# Patient Record
Sex: Female | Born: 1960 | Race: Black or African American | Hispanic: No | Marital: Married | Smoking: Former smoker
Health system: Southern US, Community
[De-identification: ages and names within clinical notes are randomized; demographics above are authoritative.]

## PROBLEM LIST (undated history)

## (undated) DIAGNOSIS — I82409 Acute embolism and thrombosis of unspecified deep veins of unspecified lower extremity: Secondary | ICD-10-CM

## (undated) DIAGNOSIS — T7840XA Allergy, unspecified, initial encounter: Secondary | ICD-10-CM

## (undated) DIAGNOSIS — E7800 Pure hypercholesterolemia, unspecified: Secondary | ICD-10-CM

## (undated) DIAGNOSIS — G459 Transient cerebral ischemic attack, unspecified: Secondary | ICD-10-CM

## (undated) DIAGNOSIS — I10 Essential (primary) hypertension: Secondary | ICD-10-CM

## (undated) DIAGNOSIS — E119 Type 2 diabetes mellitus without complications: Secondary | ICD-10-CM

## (undated) HISTORY — DX: Allergy, unspecified, initial encounter: T78.40XA

## (undated) HISTORY — PX: SHOULDER SURGERY: SHX246

## (undated) HISTORY — DX: Transient cerebral ischemic attack, unspecified: G45.9

---

## 2003-07-23 HISTORY — PX: OTHER SURGICAL HISTORY: SHX169

## 2007-07-23 HISTORY — PX: OTHER SURGICAL HISTORY: SHX169

## 2009-12-14 ENCOUNTER — Emergency Department: Payer: Self-pay | Admitting: Emergency Medicine

## 2010-05-25 ENCOUNTER — Observation Stay: Payer: Self-pay | Admitting: *Deleted

## 2010-08-13 ENCOUNTER — Ambulatory Visit: Payer: Self-pay | Admitting: Family Medicine

## 2010-08-31 ENCOUNTER — Ambulatory Visit: Payer: Self-pay | Admitting: Otolaryngology

## 2010-09-03 ENCOUNTER — Ambulatory Visit: Payer: Self-pay | Admitting: Otolaryngology

## 2010-09-04 LAB — PATHOLOGY REPORT

## 2010-10-08 ENCOUNTER — Ambulatory Visit: Payer: Self-pay | Admitting: Otolaryngology

## 2010-10-09 LAB — PATHOLOGY REPORT

## 2011-07-23 DIAGNOSIS — I639 Cerebral infarction, unspecified: Secondary | ICD-10-CM | POA: Insufficient documentation

## 2011-07-23 DIAGNOSIS — G459 Transient cerebral ischemic attack, unspecified: Secondary | ICD-10-CM

## 2011-07-23 HISTORY — DX: Transient cerebral ischemic attack, unspecified: G45.9

## 2011-07-23 HISTORY — PX: OTHER SURGICAL HISTORY: SHX169

## 2011-09-09 ENCOUNTER — Ambulatory Visit: Payer: Self-pay | Admitting: Family Medicine

## 2012-06-09 ENCOUNTER — Emergency Department: Payer: Self-pay | Admitting: Emergency Medicine

## 2012-06-09 LAB — CBC
HCT: 43 % (ref 35.0–47.0)
HGB: 14.5 g/dL (ref 12.0–16.0)
MCH: 30.5 pg (ref 26.0–34.0)
MCHC: 33.7 g/dL (ref 32.0–36.0)
MCV: 91 fL (ref 80–100)
RDW: 14.9 % — ABNORMAL HIGH (ref 11.5–14.5)
WBC: 4.3 10*3/uL (ref 3.6–11.0)

## 2012-11-04 ENCOUNTER — Emergency Department: Payer: Self-pay | Admitting: Urology

## 2013-03-24 ENCOUNTER — Emergency Department: Payer: Self-pay | Admitting: Emergency Medicine

## 2013-03-24 DIAGNOSIS — I82412 Acute embolism and thrombosis of left femoral vein: Secondary | ICD-10-CM | POA: Insufficient documentation

## 2013-03-24 LAB — COMPREHENSIVE METABOLIC PANEL
Albumin: 3 g/dL — ABNORMAL LOW (ref 3.4–5.0)
Anion Gap: 3 — ABNORMAL LOW (ref 7–16)
BUN: 6 mg/dL — ABNORMAL LOW (ref 7–18)
Bilirubin,Total: 0.3 mg/dL (ref 0.2–1.0)
Calcium, Total: 9.2 mg/dL (ref 8.5–10.1)
Chloride: 107 mmol/L (ref 98–107)
Co2: 27 mmol/L (ref 21–32)
Creatinine: 0.72 mg/dL (ref 0.60–1.30)
EGFR (African American): >60
EGFR (Non-African Amer.): >60
Osmolality: 271 (ref 275–301)
Potassium: 3.8 mmol/L (ref 3.5–5.1)
SGOT(AST): 24 U/L (ref 15–37)
SGPT (ALT): 25 U/L (ref 12–78)

## 2013-03-24 LAB — CBC
HCT: 38.6 % (ref 35.0–47.0)
HGB: 13.2 g/dL (ref 12.0–16.0)
RBC: 4.28 10*6/uL (ref 3.80–5.20)
RDW: 15.5 % — ABNORMAL HIGH (ref 11.5–14.5)
WBC: 5.9 10*3/uL (ref 3.6–11.0)

## 2013-04-14 DIAGNOSIS — I82402 Acute embolism and thrombosis of unspecified deep veins of left lower extremity: Secondary | ICD-10-CM | POA: Insufficient documentation

## 2013-04-16 ENCOUNTER — Ambulatory Visit: Payer: Self-pay | Admitting: Internal Medicine

## 2013-04-21 ENCOUNTER — Ambulatory Visit: Payer: Self-pay | Admitting: Internal Medicine

## 2013-05-03 ENCOUNTER — Ambulatory Visit: Payer: Self-pay | Admitting: Internal Medicine

## 2013-05-04 LAB — PROT IMMUNOELECTROPHORES(ARMC)

## 2013-05-04 LAB — CANCER ANTIGEN 19-9: CA 19-9: <1 U/mL (ref 0–35)

## 2013-05-04 LAB — CEA: CEA: 3.4 ng/mL (ref 0.0–4.7)

## 2013-05-17 ENCOUNTER — Ambulatory Visit: Payer: Self-pay

## 2013-05-17 DIAGNOSIS — R760 Raised antibody titer: Secondary | ICD-10-CM | POA: Insufficient documentation

## 2013-05-22 ENCOUNTER — Ambulatory Visit: Payer: Self-pay | Admitting: Internal Medicine

## 2013-05-25 ENCOUNTER — Ambulatory Visit: Payer: Self-pay

## 2013-06-14 LAB — TSH: Thyroid Stimulating Horm: 2.11 u[IU]/mL

## 2013-06-21 ENCOUNTER — Ambulatory Visit: Payer: Self-pay | Admitting: Internal Medicine

## 2013-07-22 ENCOUNTER — Ambulatory Visit: Payer: Self-pay | Admitting: Internal Medicine

## 2013-08-22 ENCOUNTER — Ambulatory Visit: Payer: Self-pay | Admitting: Internal Medicine

## 2013-09-19 ENCOUNTER — Ambulatory Visit: Payer: Self-pay | Admitting: Internal Medicine

## 2013-10-19 ENCOUNTER — Emergency Department: Payer: Self-pay | Admitting: Emergency Medicine

## 2013-10-19 LAB — URINALYSIS, COMPLETE
BILIRUBIN, UR: NEGATIVE
Bacteria: NONE SEEN
Blood: NEGATIVE
GLUCOSE, UR: NEGATIVE mg/dL (ref 0–75)
KETONE: NEGATIVE
Leukocyte Esterase: NEGATIVE
Nitrite: NEGATIVE
PH: 7 (ref 4.5–8.0)
PROTEIN: NEGATIVE
SPECIFIC GRAVITY: 1.016 (ref 1.003–1.030)
WBC UR: 1 /HPF (ref 0–5)

## 2013-10-20 ENCOUNTER — Ambulatory Visit: Payer: Self-pay | Admitting: Internal Medicine

## 2013-10-22 LAB — CREATININE, SERUM: Creatinine: 0.93 mg/dL (ref 0.60–1.30)

## 2013-11-07 ENCOUNTER — Emergency Department: Payer: Self-pay | Admitting: Emergency Medicine

## 2013-11-07 LAB — COMPREHENSIVE METABOLIC PANEL
ALBUMIN: 3.6 g/dL (ref 3.4–5.0)
ANION GAP: 2 — AB (ref 7–16)
Alkaline Phosphatase: 61 U/L
BUN: 12 mg/dL (ref 7–18)
Bilirubin,Total: 0.2 mg/dL (ref 0.2–1.0)
CALCIUM: 8.8 mg/dL (ref 8.5–10.1)
CO2: 26 mmol/L (ref 21–32)
CREATININE: 0.87 mg/dL (ref 0.60–1.30)
Chloride: 111 mmol/L — ABNORMAL HIGH (ref 98–107)
EGFR (African American): >60
Glucose: 85 mg/dL (ref 65–99)
OSMOLALITY: 277 (ref 275–301)
POTASSIUM: 3.8 mmol/L (ref 3.5–5.1)
SGOT(AST): 28 U/L (ref 15–37)
SGPT (ALT): 29 U/L (ref 12–78)
SODIUM: 139 mmol/L (ref 136–145)
Total Protein: 7.6 g/dL (ref 6.4–8.2)

## 2013-11-07 LAB — CBC
HCT: 38.7 % (ref 35.0–47.0)
HGB: 12.4 g/dL (ref 12.0–16.0)
MCH: 28.3 pg (ref 26.0–34.0)
MCHC: 32.1 g/dL (ref 32.0–36.0)
MCV: 88 fL (ref 80–100)
PLATELETS: 273 10*3/uL (ref 150–440)
RBC: 4.41 10*6/uL (ref 3.80–5.20)
RDW: 16.8 % — ABNORMAL HIGH (ref 11.5–14.5)
WBC: 4.9 10*3/uL (ref 3.6–11.0)

## 2013-11-07 LAB — PROTIME-INR
INR: 1
Prothrombin Time: 13.1 s

## 2013-11-07 LAB — TROPONIN I: Troponin-I: <0.02 ng/mL

## 2013-11-19 ENCOUNTER — Ambulatory Visit: Payer: Self-pay | Admitting: Internal Medicine

## 2013-11-23 ENCOUNTER — Ambulatory Visit: Payer: Self-pay

## 2013-12-20 ENCOUNTER — Ambulatory Visit: Payer: Self-pay | Admitting: Internal Medicine

## 2014-01-19 ENCOUNTER — Ambulatory Visit: Payer: Self-pay | Admitting: Internal Medicine

## 2014-02-19 ENCOUNTER — Ambulatory Visit: Payer: Self-pay | Admitting: Internal Medicine

## 2014-02-23 ENCOUNTER — Ambulatory Visit: Payer: Self-pay | Admitting: Gastroenterology

## 2014-02-24 LAB — PATHOLOGY REPORT

## 2014-03-22 ENCOUNTER — Ambulatory Visit: Payer: Self-pay | Admitting: Internal Medicine

## 2014-04-11 LAB — CREATININE, SERUM
Creatinine: 0.94 mg/dL (ref 0.60–1.30)
EGFR (African American): >60
EGFR (Non-African Amer.): >60

## 2014-04-11 LAB — TSH: THYROID STIMULATING HORM: 1.36 u[IU]/mL

## 2014-04-11 LAB — T4, FREE: Free Thyroxine: 0.9 ng/dL (ref 0.76–1.46)

## 2014-04-11 LAB — CANCER CENTER HEMOGLOBIN: HGB: 13.4 g/dL (ref 12.0–16.0)

## 2014-04-21 ENCOUNTER — Ambulatory Visit: Payer: Self-pay | Admitting: Internal Medicine

## 2014-05-09 ENCOUNTER — Emergency Department: Payer: Self-pay | Admitting: Emergency Medicine

## 2014-05-20 ENCOUNTER — Ambulatory Visit: Payer: Self-pay | Admitting: Specialist

## 2014-05-22 ENCOUNTER — Ambulatory Visit: Payer: Self-pay | Admitting: Internal Medicine

## 2014-06-21 ENCOUNTER — Ambulatory Visit: Payer: Self-pay | Admitting: Internal Medicine

## 2014-07-06 ENCOUNTER — Ambulatory Visit: Payer: Self-pay

## 2014-07-26 LAB — HEPATIC FUNCTION PANEL: BILIRUBIN, TOTAL: 0.2 mg/dL

## 2014-07-26 LAB — BASIC METABOLIC PANEL
BUN: 17 mg/dL (ref 4–21)
CREATININE: 0.8 mg/dL (ref 0.5–1.1)
GLUCOSE: 109 mg/dL
Sodium: 139 mmol/L (ref 137–147)

## 2014-07-26 LAB — HEMOGLOBIN A1C: HEMOGLOBIN A1C: 6.7

## 2014-07-26 LAB — CBC AND DIFFERENTIAL
NEUTROS ABS: 3 /uL
WBC: 6 10^3/mL

## 2014-07-26 LAB — POCT ERYTHROCYTE SEDIMENTATION RATE, NON-AUTOMATED: Sed Rate: 55 mm

## 2014-07-28 ENCOUNTER — Ambulatory Visit: Payer: Self-pay | Admitting: Internal Medicine

## 2014-07-29 LAB — D-DIMER(ARMC): D-DIMER: 471 ng/mL

## 2014-08-22 ENCOUNTER — Ambulatory Visit: Payer: Self-pay | Admitting: Internal Medicine

## 2014-09-30 ENCOUNTER — Ambulatory Visit
Admit: 2014-09-30 | Disposition: A | Discharge status: Home / Self Care | Payer: Self-pay | Attending: Internal Medicine | Admitting: Internal Medicine

## 2014-12-07 ENCOUNTER — Ambulatory Visit: Payer: Self-pay | Admitting: Internal Medicine

## 2015-01-02 ENCOUNTER — Inpatient Hospital Stay: Payer: Self-pay | Attending: Hematology and Oncology | Admitting: Hematology and Oncology

## 2015-01-02 ENCOUNTER — Encounter: Payer: Self-pay | Admitting: Hematology and Oncology

## 2015-01-02 VITALS — BP 146/88 | HR 59 | Temp 96.8°F | Ht 64.0 in | Wt 212.7 lb

## 2015-01-02 DIAGNOSIS — I82402 Acute embolism and thrombosis of unspecified deep veins of left lower extremity: Secondary | ICD-10-CM | POA: Insufficient documentation

## 2015-01-02 DIAGNOSIS — M7989 Other specified soft tissue disorders: Secondary | ICD-10-CM | POA: Insufficient documentation

## 2015-01-02 DIAGNOSIS — R760 Raised antibody titer: Secondary | ICD-10-CM

## 2015-01-02 DIAGNOSIS — R51 Headache: Secondary | ICD-10-CM | POA: Insufficient documentation

## 2015-01-02 DIAGNOSIS — Z8673 Personal history of transient ischemic attack (TIA), and cerebral infarction without residual deficits: Secondary | ICD-10-CM | POA: Insufficient documentation

## 2015-01-02 DIAGNOSIS — Z79899 Other long term (current) drug therapy: Secondary | ICD-10-CM | POA: Insufficient documentation

## 2015-01-02 DIAGNOSIS — Z801 Family history of malignant neoplasm of trachea, bronchus and lung: Secondary | ICD-10-CM | POA: Insufficient documentation

## 2015-01-02 DIAGNOSIS — Z800 Family history of malignant neoplasm of digestive organs: Secondary | ICD-10-CM | POA: Insufficient documentation

## 2015-01-02 DIAGNOSIS — I82412 Acute embolism and thrombosis of left femoral vein: Secondary | ICD-10-CM

## 2015-01-02 DIAGNOSIS — F17210 Nicotine dependence, cigarettes, uncomplicated: Secondary | ICD-10-CM | POA: Insufficient documentation

## 2015-01-02 DIAGNOSIS — Z809 Family history of malignant neoplasm, unspecified: Secondary | ICD-10-CM | POA: Insufficient documentation

## 2015-01-02 NOTE — Progress Notes (Signed)
Pt here today for follow up regarding DVT; offers no complaints

## 2015-01-02 NOTE — Progress Notes (Signed)
[facility]  Clinic day:  01/02/2015  Chief Complaint: Jessica Norton is an 54 y.o. female  with a history of left lower extremity DVT who is seen for reassessment.  HPI: The patient presented on 03/24/2013 with left lower extremity pain and swelling.   Lower extremity duplex revealed a thrombus in the left superficial femoral vein. She denied any precipitating events. She was not on birth control pills.   She underwent a hypercoagulable workup from 05/03/2013 until 06/14/2013. Labs were negative for factor V Leiden and prothrombin gene mutation. Normal labs included ATIII activity (114%) and antigen (103%) level, protein C functional (175%), and protein S functional (90%).  Lupus anticoagulate panel was negative. Beta-2 glycoprotein was negative.  Anticardiolipin antibodies revealed an IgM of 38 (low-medium positive).  The patient has had serial follow-up anticardiolipin antibodies. Her IgM has persistently been positive on 07/30/2013, 10/22/2013, 03/07/2014, 05/09/2014 and 07/29/2014. Anticardiolipin antibody IgM was 21 (low positive) on 07/29/2014. She had a follow-up lower extremity duplex on 10/14/2013 which revealed resolution of her clot.  The patient has an extensive family history of malignancy. Paternal grandfather had lung cancer, a maternal uncle had colon cancer, a maternal uncle had gastric cancer, a maternal uncle had head and neck cancer. Her father had a DVT in his 82s associated with a hospitalization for pancreatitis.   She is up-to-date on her preventative care. Colonoscopy on 02/23/2014 by Dr. Charolotte Capuchin was unremarkable. Pelvic exam was normal. Last mammogram on 05/2014 was negative. Dr. Lorre Nick checked tumor markers (CA 125, CA-19-9, and CEA) as well as an SPEP on 05/03/13. All studies were negative.  Symptomatically, she notes some mild leg swelling about 3 weeks ago. She states that she is trying to do 6000 steps a day. She has some knee issues.  She has some headaches. She note a history of a stroke in 2013.  She is on no Xarelto.  No past medical history on file.  Past Surgical History  Procedure Laterality Date  . Shoulder surgery  2006 both shoulders  . Vocal chord surrgery  2013  . Thumb surgery  2009  . Removed tumor from back  2005    Family History  Problem Relation Age of Onset  . Cancer Paternal Grandfather     Social History:  reports that she quit smoking about 2 weeks ago. Her smoking use included Cigarettes. She has a 10 pack-year smoking history. She has never used smokeless tobacco. She reports that she drinks about 1.2 oz of alcohol per week. She reports that she does not use illicit drugs.  The patient is alone today.  Allergies: Allergies no known allergies  Current Medications: Current Outpatient Prescriptions  Medication Sig Dispense Refill  . amLODipine (NORVASC) 10 MG tablet Take 10 mg by mouth.    Marland Kitchen atorvastatin (LIPITOR) 20 MG tablet 1 po at bedtime for cholesterol    . buPROPion (WELLBUTRIN SR) 150 MG 12 hr tablet Take 150 mg by mouth.    . hydrochlorothiazide (HYDRODIURIL) 25 MG tablet Take 25 mg by mouth.    . rivaroxaban (XARELTO) 20 MG TABS tablet Take 20 mg by mouth.     No current facility-administered medications for this visit.    Review of Systems:  GENERAL:  Feels "ok".  No fevers, sweats or weight loss. PERFORMANCE STATUS (ECOG):  1 HEENT:  No visual changes, runny nose, sore throat, mouth sores or tenderness. Lungs: No shortness of breath or cough.  No hemoptysis. Cardiac:  Transient chest pain (3 seconds).  Nopalpitations, orthopnea, or PND. GI:  No nausea, vomiting, diarrhea, constipation, melena or hematochezia. GU:  No urgency, frequency, dysuria, or hematuria. Musculoskeletal:  No back pain.  Knee issues.  No muscle tenderness. Extremities:  Leg swelling 3 weeks ago.  No pain. Skin:  No rashes or skin changes. Neuro:  Poor memory.  Headaches. No numbness or weakness,  balance or coordination issues. Endocrine:  No diabetes, thyroid issues, hot flashes or night sweats. Psych:  No mood changes, depression or anxiety. Pain:  No focal pain. Review of systems:  All other systems reviewed and found to be negative.   Physical Exam: Blood pressure 146/88, pulse 59, temperature 96.8 F (36 C), temperature source Tympanic, height 5\' 4"  (1.626 m), weight 212 lb 11.9 oz (96.5 kg). GENERAL:  Well developed, well nourished, sitting comfortably in the exam room in no acute distress. MENTAL STATUS:  Alert and oriented to person, place and time. HEAD:  Short brown hair.  Vertical scar on forehead.  Normocephalic, atraumatic, face symmetric, no Cushingoid features. EYES:  Brown eyes.  Pupils equal round and reactive to light and accomodation.  No conjunctivitis or scleral icterus. ENT:  Oropharynx clear without lesion.  Tongue normal. Mucous membranes moist.  RESPIRATORY:  Clear to auscultation without rales, wheezes or rhonchi. CARDIOVASCULAR:  Regular rate and rhythm without murmur, rub or gallop. ABDOMEN:  Soft, non-tender, with active bowel sounds, and no hepatosplenomegaly.  No masses. SKIN:  No rashes, ulcers or lesions. EXTREMITIES: No edema, no skin discoloration or tenderness.  No palpable cords. LYMPH NODES: No palpable cervical, supraclavicular, axillary or inguinal adenopathy  NEUROLOGICAL: Unremarkable. PSYCH:  Appropriate.  No visits with results within 3 Day(s) from this visit. Latest known visit with results is:  [facility] Conversion on 07/28/2014  Component Date Value Ref Range Status  . D-Dimer 07/29/2014 471   Final   Comment: INTERPRETATION <> Exclusion of Venous Thromboembolism (VTE) - OUTPATIENT ONLY       (Emergency Department or [place])             0-499 ng/ml (FEU)  : With a low to intermediate pretest                                  probability for VTE this test result                                  excludes the diagnosis of VTE.              > 499 ng/ml (FEU)  : VTE not excluded; additional work up                                  for VTE is required. <> Testing on Inpatients and Evaluation of Disseminated Intravascular        Coagulation (DIC)             Reference Range:  0-499 ng/ml (FEU)     Assessment:  Jessica Norton is an 54 y.o. female with a history of an unprovoked left lower extremity DVT on 03/24/2013.  She hypercoagulable workup which was negative for factor V Leiden, prothrombin gene mutation, protein C activity, protein S activity, and anti-thrombin III antigen and activity. Lupus anticoagulate panel was  negative. Beta-2 glycoprotein was negative. 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    She has a history of CVA in 2013.  She has an extensive family history of malignancy.  Symptomatically, she denies any weight loss. He notes some chronic kidney issues and a transient left leg swelling. Exam is unremarkable.  Plan: 1. Review entire medical history, diagnosis and management of thrombosis.  Discuss plan to review prior hypercoagulable workup. Concern is raised for possible anticardiolipin antibody syndrome given her history of CVA and elevated anticardiolipin antibody (IgM).  Discuss risks for both arterial and venous clots with a lupus anticoagulant   Unable to reassess lupus anticoagulant panel on Xarelto.  Discuss plan for ongoing Xarelto. 2. RTC in 3 months for MD assessment and labs.   Rosey Bath, MD  01/02/2015, 12:21 PM

## 2015-03-12 ENCOUNTER — Emergency Department: Payer: Self-pay

## 2015-03-12 ENCOUNTER — Emergency Department
Admission: EM | Admit: 2015-03-12 | Discharge: 2015-03-12 | Disposition: A | Discharge status: Home / Self Care | Payer: Self-pay | Attending: Emergency Medicine | Admitting: Emergency Medicine

## 2015-03-12 ENCOUNTER — Encounter: Payer: Self-pay | Admitting: Emergency Medicine

## 2015-03-12 DIAGNOSIS — M5441 Lumbago with sciatica, right side: Secondary | ICD-10-CM | POA: Insufficient documentation

## 2015-03-12 DIAGNOSIS — Z86718 Personal history of other venous thrombosis and embolism: Secondary | ICD-10-CM | POA: Insufficient documentation

## 2015-03-12 DIAGNOSIS — E119 Type 2 diabetes mellitus without complications: Secondary | ICD-10-CM | POA: Insufficient documentation

## 2015-03-12 DIAGNOSIS — Z79899 Other long term (current) drug therapy: Secondary | ICD-10-CM | POA: Insufficient documentation

## 2015-03-12 DIAGNOSIS — I10 Essential (primary) hypertension: Secondary | ICD-10-CM | POA: Insufficient documentation

## 2015-03-12 DIAGNOSIS — Z7901 Long term (current) use of anticoagulants: Secondary | ICD-10-CM | POA: Insufficient documentation

## 2015-03-12 DIAGNOSIS — Z87891 Personal history of nicotine dependence: Secondary | ICD-10-CM | POA: Insufficient documentation

## 2015-03-12 HISTORY — DX: Type 2 diabetes mellitus without complications: E11.9

## 2015-03-12 HISTORY — DX: Essential (primary) hypertension: I10

## 2015-03-12 HISTORY — DX: Pure hypercholesterolemia, unspecified: E78.00

## 2015-03-12 HISTORY — DX: Acute embolism and thrombosis of unspecified deep veins of unspecified lower extremity: I82.409

## 2015-03-12 MED ORDER — CYCLOBENZAPRINE HCL 10 MG PO TABS
10.0000 mg | ORAL_TABLET | Freq: Once | ORAL | Status: AC
  Administered 2015-03-12: 10 mg via ORAL
  Filled 2015-03-12: qty 1

## 2015-03-12 MED ORDER — CYCLOBENZAPRINE HCL 10 MG PO TABS: 10.0000 mg | ORAL_TABLET | Freq: Three times a day (TID) | ORAL | Status: DC | PRN

## 2015-03-12 NOTE — Discharge Instructions (Signed)
Back Pain, Adult °Back pain is very common. The pain often gets better over time. The cause of back pain is usually not dangerous. Most people can learn to manage their back pain on their own.  °HOME CARE  °· Stay active. Start with short walks on flat ground if you can. Try to walk farther each day. °· Do not sit, drive, or stand in one place for more than 30 minutes. Do not stay in bed. °· Do not avoid exercise or work. Activity can help your back heal faster. °· Be careful when you bend or lift an object. Bend at your knees, keep the object close to you, and do not twist. °· Sleep on a firm mattress. Lie on your side, and bend your knees. If you lie on your back, put a pillow under your knees. °· Only take medicines as told by your doctor. °· Put ice on the injured area. °¨ Put ice in a plastic bag. °¨ Place a towel between your skin and the bag. °¨ Leave the ice on for 15-20 minutes, 03-04 times a day for the first 2 to 3 days. After that, you can switch between ice and heat packs. °· Ask your doctor about back exercises or massage. °· Avoid feeling anxious or stressed. Find good ways to deal with stress, such as exercise. °GET HELP RIGHT AWAY IF:  °· Your pain does not go away with rest or medicine. °· Your pain does not go away in 1 week. °· You have new problems. °· You do not feel well. °· The pain spreads into your legs. °· You cannot control when you poop (bowel movement) or pee (urinate). °· Your arms or legs feel weak or lose feeling (numbness). °· You feel sick to your stomach (nauseous) or throw up (vomit). °· You have belly (abdominal) pain. °· You feel like you may pass out (faint). °MAKE SURE YOU:  °· Understand these instructions. °· Will watch your condition. °· Will get help right away if you are not doing well or get worse. °Document Released: 12/25/2007 Document Revised: 09/30/2011 Document Reviewed: 11/09/2013 °[employer]® Patient Information ©2015 [employer]. This information is not intended  to replace advice given to you by your health care provider. Make sure you discuss any questions you have with your health care provider. ° °

## 2015-03-12 NOTE — ED Notes (Signed)
Pt presents to the ER from home with complaints of right leg pain and groin pain, pt reports she has been having groin pain for about a year. Pt reports groin pain radiates to back of right pain. Pt reports she has a history of DVT and is supposed to take Xarelto but has not in the past 4 weeks. Pt denies any other medical symptom at present.

## 2015-03-12 NOTE — ED Provider Notes (Signed)
[facility] Emergency Department Provider Note  ____________________________________________  Time seen: 5:00 PM  I have reviewed the triage vital signs and the nursing notes.   HISTORY  Chief Complaint Groin Pain and Leg Pain     HPI Jessica Norton is a 54 y.o. female presents with low back pain that radiates down the posterior portion of her right leg/groin times one year. Patient states that her pain has worsened over the past week. Of note patient has a history of a DVT in her left lower extremity for which she is prescribed Xarelto but has been noncompliant for the past 4 weeks. Patient states her noncompliance secondary to heavy menstruation (patient is not currently on her menses). Patient denies any dyspnea no chest pain     Past Medical History  Diagnosis Date  . Allergy   . DVT (deep venous thrombosis)   . Diabetes mellitus without complication   . Hypertension   . High cholesterol     There are no active problems to display for this patient.   Past Surgical History  Procedure Laterality Date  . Shoulder surgery  2006 both shoulders  . Vocal chord surrgery  2013  . Thumb surgery  2009  . Removed tumor from back  2005    Current Outpatient Rx  Name  Route  Sig  Dispense  Refill  . amLODipine (NORVASC) 10 MG tablet   Oral   Take 10 mg by mouth.         Marland Kitchen atorvastatin (LIPITOR) 20 MG tablet      1 po at bedtime for cholesterol         . buPROPion (WELLBUTRIN SR) 150 MG 12 hr tablet   Oral   Take 150 mg by mouth.         . cyclobenzaprine (FLEXERIL) 10 MG tablet   Oral   Take 1 tablet (10 mg total) by mouth 3 (three) times daily as needed for muscle spasms.   30 tablet   0   . hydrochlorothiazide (HYDRODIURIL) 25 MG tablet   Oral   Take 25 mg by mouth.         . rivaroxaban (XARELTO) 20 MG TABS tablet   Oral   Take 20 mg by mouth.           Allergies Review of patient's allergies indicates no known  allergies.  Family History  Problem Relation Age of Onset  . Cancer Paternal Grandfather     Social History Social History  Substance Use Topics  . Smoking status: Former Smoker -- 0.50 packs/day for 20 years    Types: Cigarettes    Quit date: 12/14/2014  . Smokeless tobacco: Never Used  . Alcohol Use: 1.2 oz/week    2 Glasses of wine per week    Review of Systems  Constitutional: Negative for fever. Eyes: Negative for visual changes. ENT: Negative for sore throat. Cardiovascular: Negative for chest pain. Respiratory: Negative for shortness of breath. Gastrointestinal: Negative for abdominal pain, vomiting and diarrhea. Genitourinary: Negative for dysuria. Musculoskeletal: Negative for back pain. Skin: Negative for rash. Neurological: Negative for headaches, focal weakness or numbness.   10-point ROS otherwise negative.  ____________________________________________   PHYSICAL EXAM:  VITAL SIGNS: ED Triage Vitals  Enc Vitals Group     BP 03/12/15 1335 145/94 mmHg     Pulse Rate 03/12/15 1335 74     Resp 03/12/15 1335 20     Temp 03/12/15 1335 97.9 F (36.6 C)  Temp Source 03/12/15 1335 Oral     SpO2 03/12/15 1335 97 %     Weight 03/12/15 1335 209 lb (94.802 kg)     Height 03/12/15 1335 5\' 4"  (1.626 m)     Head Cir --      Peak Flow --      Pain Score 03/12/15 1336 8     Pain Loc --      Pain Edu? --      Excl. in GC? --      Constitutional: Alert and oriented. Well appearing and in no distress. Eyes: Conjunctivae are normal. PERRL. Normal extraocular movements. ENT   Head: Normocephalic and atraumatic.   Nose: No congestion/rhinnorhea.   Mouth/Throat: Mucous membranes are moist.   Neck: No stridor. Hematological/Lymphatic/Immunilogical: No cervical lymphadenopathy. Cardiovascular: Normal rate, regular rhythm. Normal and symmetric distal pulses are present in all extremities. No murmurs, rubs, or gallops. Respiratory: Normal  respiratory effort without tachypnea nor retractions. Breath sounds are clear and equal bilaterally. No wheezes/rales/rhonchi. Gastrointestinal: Soft and nontender. No distention. There is no CVA tenderness. Genitourinary: deferred Musculoskeletal: Nontender with normal range of motion in all extremities. No joint effusions.  No lower extremity tenderness nor edema. Pain with palpation L4-5 and S1. Neurologic:  Normal speech and language. No gross focal neurologic deficits are appreciated. Speech is normal.  Skin:  Skin is warm, dry and intact. No rash noted. Psychiatric: Mood and affect are normal. Speech and behavior are normal. Patient exhibits appropriate insight and judgment.    RADIOLOGY  US Venous Img Lower Bilateral (Final result) Result time: 03/12/15 17:24:36   Final result by Rad Results In Interface (03/12/15 17:24:36)   Narrative:   CLINICAL DATA: One week history of bilateral lower extremity pain extending from the groin distally. Prior history of left femoral vein DVT in 2014 for which the patient had been anticoagulated with Xarelto, though she discontinued this 4 weeks ago.  EXAM: BILATERAL LOWER EXTREMITY VENOUS DOPPLER ULTRASOUND  TECHNIQUE: Gray-scale sonography with graded compression, as well as color Doppler and duplex ultrasound were performed to evaluate the lower extremity deep venous systems from the level of the common femoral vein and including the common femoral, femoral, profunda femoral, popliteal and calf veins including the posterior tibial, peroneal and gastrocnemius veins when visible. The superficial great saphenous vein was also interrogated. Spectral Doppler was utilized to evaluate flow at rest and with distal augmentation maneuvers in the common femoral, femoral and popliteal veins.  COMPARISON: Left lower extremity venous Doppler ultrasound 10/11/2013, 06/28/2013, 03/24/2013. Right lower extremity venous Doppler ultrasound  05/09/2014.  FINDINGS: RIGHT LOWER EXTREMITY  Common Femoral Vein: No evidence of thrombus. Normal compressibility, respiratory phasicity and response to augmentation.  Saphenofemoral Junction: No evidence of thrombus. Normal compressibility and flow on color Doppler imaging.  Profunda Femoral Vein: No evidence of thrombus. Normal compressibility and flow on color Doppler imaging.  Femoral Vein: No evidence of thrombus. Normal compressibility, respiratory phasicity and response to augmentation.  Popliteal Vein: No evidence of thrombus. Normal compressibility, respiratory phasicity and response to augmentation.  Calf Veins: No evidence of thrombus. Normal compressibility and flow on color Doppler imaging.  Superficial Great Saphenous Vein: No evidence of thrombus. Normal compressibility and flow on color Doppler imaging.  Venous Reflux: Not examined.  Other Findings: None.  LEFT LOWER EXTREMITY  Common Femoral Vein: No evidence of thrombus. Normal compressibility, respiratory phasicity and response to augmentation.  Saphenofemoral Junction: No evidence of thrombus. Normal compressibility and flow on color Doppler imaging.  Profunda Femoral Vein: No evidence of thrombus. Normal compressibility and flow on color Doppler imaging.  Femoral Vein: No evidence of thrombus. Normal compressibility, respiratory phasicity and response to augmentation. No evidence of wall thickening to suggest significant chronic DVT.  Popliteal Vein: No evidence of thrombus. Normal compressibility, respiratory phasicity and response to augmentation.  Calf Veins: No evidence of thrombus. Normal compressibility and flow on color Doppler imaging.  Superficial Great Saphenous Vein: No evidence of thrombus. Normal compressibility and flow on color Doppler imaging.  Venous Reflux: Not examined.  Other Findings: None.  IMPRESSION: No evidence of DVT involving either the right or left  lower extremity.   Electronically Signed By: Hulan Saas M.D. On: 03/12/2015 17:24          INITIAL IMPRESSION / ASSESSMENT AND PLAN / ED COURSE  Pertinent labs & imaging results that were available during my care of the patient were reviewed by me and considered in my medical decision making (see chart for details).  History of physical exam consistent with possible sciatica as such patient referred to orthopedics on-call. Patient received several with improvement of pain.  ____________________________________________   FINAL CLINICAL IMPRESSION(S) / ED DIAGNOSES  Final diagnoses:  Right-sided low back pain with right-sided sciatica      Darci Current, MD 03/13/15 2349

## 2015-04-02 ENCOUNTER — Encounter: Payer: Self-pay | Admitting: Hematology and Oncology

## 2015-04-03 ENCOUNTER — Inpatient Hospital Stay: Payer: Self-pay | Admitting: Hematology and Oncology

## 2015-04-03 ENCOUNTER — Inpatient Hospital Stay: Payer: Self-pay

## 2015-04-13 ENCOUNTER — Inpatient Hospital Stay: Payer: Self-pay

## 2015-04-13 ENCOUNTER — Inpatient Hospital Stay: Payer: Self-pay | Admitting: Hematology and Oncology

## 2015-04-20 ENCOUNTER — Inpatient Hospital Stay: Payer: Self-pay | Admitting: Hematology and Oncology

## 2015-04-20 ENCOUNTER — Inpatient Hospital Stay: Payer: Self-pay

## 2015-05-05 ENCOUNTER — Inpatient Hospital Stay: Payer: Self-pay | Admitting: Hematology and Oncology

## 2015-05-05 ENCOUNTER — Inpatient Hospital Stay: Payer: Self-pay

## 2015-05-26 ENCOUNTER — Emergency Department
Admission: EM | Admit: 2015-05-26 | Discharge: 2015-05-26 | Disposition: A | Discharge status: Home / Self Care | Payer: No Typology Code available for payment source | Attending: Emergency Medicine | Admitting: Emergency Medicine

## 2015-05-26 ENCOUNTER — Emergency Department: Payer: No Typology Code available for payment source

## 2015-05-26 ENCOUNTER — Encounter: Payer: Self-pay | Admitting: *Deleted

## 2015-05-26 DIAGNOSIS — S299XXA Unspecified injury of thorax, initial encounter: Secondary | ICD-10-CM | POA: Diagnosis not present

## 2015-05-26 DIAGNOSIS — S199XXA Unspecified injury of neck, initial encounter: Secondary | ICD-10-CM | POA: Diagnosis not present

## 2015-05-26 DIAGNOSIS — S0083XA Contusion of other part of head, initial encounter: Secondary | ICD-10-CM | POA: Insufficient documentation

## 2015-05-26 DIAGNOSIS — S0093XA Contusion of unspecified part of head, initial encounter: Secondary | ICD-10-CM

## 2015-05-26 DIAGNOSIS — E119 Type 2 diabetes mellitus without complications: Secondary | ICD-10-CM | POA: Diagnosis not present

## 2015-05-26 DIAGNOSIS — Z79899 Other long term (current) drug therapy: Secondary | ICD-10-CM | POA: Insufficient documentation

## 2015-05-26 DIAGNOSIS — Y92410 Unspecified street and highway as the place of occurrence of the external cause: Secondary | ICD-10-CM | POA: Diagnosis not present

## 2015-05-26 DIAGNOSIS — Y9389 Activity, other specified: Secondary | ICD-10-CM | POA: Insufficient documentation

## 2015-05-26 DIAGNOSIS — Y998 Other external cause status: Secondary | ICD-10-CM | POA: Diagnosis not present

## 2015-05-26 DIAGNOSIS — Z87891 Personal history of nicotine dependence: Secondary | ICD-10-CM | POA: Diagnosis not present

## 2015-05-26 DIAGNOSIS — S0990XA Unspecified injury of head, initial encounter: Secondary | ICD-10-CM | POA: Diagnosis present

## 2015-05-26 DIAGNOSIS — S4990XA Unspecified injury of shoulder and upper arm, unspecified arm, initial encounter: Secondary | ICD-10-CM | POA: Insufficient documentation

## 2015-05-26 DIAGNOSIS — I10 Essential (primary) hypertension: Secondary | ICD-10-CM | POA: Insufficient documentation

## 2015-05-26 MED ORDER — HYDROCODONE-ACETAMINOPHEN 5-325 MG PO TABS: 1.0000 | ORAL_TABLET | ORAL | Status: DC | PRN

## 2015-05-26 MED ORDER — BACLOFEN 10 MG PO TABS: 10.0000 mg | ORAL_TABLET | Freq: Three times a day (TID) | ORAL | Status: DC
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
## 2015-06-02 ENCOUNTER — Inpatient Hospital Stay: Payer: Self-pay

## 2015-06-02 ENCOUNTER — Inpatient Hospital Stay: Payer: Self-pay | Attending: Hematology and Oncology | Admitting: Hematology and Oncology

## 2015-06-02 VITALS — BP 136/94 | HR 79 | Temp 97.3°F | Wt 206.4 lb

## 2015-06-02 DIAGNOSIS — E785 Hyperlipidemia, unspecified: Secondary | ICD-10-CM | POA: Insufficient documentation

## 2015-06-02 DIAGNOSIS — I82412 Acute embolism and thrombosis of left femoral vein: Secondary | ICD-10-CM

## 2015-06-02 DIAGNOSIS — Z809 Family history of malignant neoplasm, unspecified: Secondary | ICD-10-CM | POA: Insufficient documentation

## 2015-06-02 DIAGNOSIS — Z79899 Other long term (current) drug therapy: Secondary | ICD-10-CM | POA: Insufficient documentation

## 2015-06-02 DIAGNOSIS — M25532 Pain in left wrist: Secondary | ICD-10-CM | POA: Insufficient documentation

## 2015-06-02 DIAGNOSIS — E119 Type 2 diabetes mellitus without complications: Secondary | ICD-10-CM | POA: Insufficient documentation

## 2015-06-02 DIAGNOSIS — Z8673 Personal history of transient ischemic attack (TIA), and cerebral infarction without residual deficits: Secondary | ICD-10-CM | POA: Insufficient documentation

## 2015-06-02 DIAGNOSIS — Z86718 Personal history of other venous thrombosis and embolism: Secondary | ICD-10-CM | POA: Insufficient documentation

## 2015-06-02 DIAGNOSIS — Z7984 Long term (current) use of oral hypoglycemic drugs: Secondary | ICD-10-CM | POA: Insufficient documentation

## 2015-06-02 DIAGNOSIS — I82512 Chronic embolism and thrombosis of left femoral vein: Secondary | ICD-10-CM

## 2015-06-02 DIAGNOSIS — Z87891 Personal history of nicotine dependence: Secondary | ICD-10-CM | POA: Insufficient documentation

## 2015-06-02 DIAGNOSIS — M25531 Pain in right wrist: Secondary | ICD-10-CM | POA: Insufficient documentation

## 2015-06-02 DIAGNOSIS — I10 Essential (primary) hypertension: Secondary | ICD-10-CM | POA: Insufficient documentation

## 2015-06-02 LAB — CBC WITH DIFFERENTIAL/PLATELET
Basophils Absolute: 0 10*3/uL (ref 0–0.1)
Basophils Relative: 1 %
Eosinophils Absolute: 0 10*3/uL (ref 0–0.7)
Eosinophils Relative: 1 %
HCT: 39.8 % (ref 35.0–47.0)
Hemoglobin: 13.3 g/dL (ref 12.0–16.0)
Lymphocytes Relative: 41 %
Lymphs Abs: 1.4 10*3/uL (ref 1.0–3.6)
MCH: 29.2 pg (ref 26.0–34.0)
MCHC: 33.3 g/dL (ref 32.0–36.0)
MCV: 87.8 fL (ref 80.0–100.0)
Monocytes Absolute: 0.3 10*3/uL (ref 0.2–0.9)
Monocytes Relative: 10 %
Neutro Abs: 1.6 10*3/uL (ref 1.4–6.5)
Neutrophils Relative %: 47 %
Platelets: 288 10*3/uL (ref 150–440)
RBC: 4.54 MIL/uL (ref 3.80–5.20)
RDW: 15.9 % — ABNORMAL HIGH (ref 11.5–14.5)
WBC: 3.4 10*3/uL — ABNORMAL LOW (ref 3.6–11.0)

## 2015-06-02 LAB — COMPREHENSIVE METABOLIC PANEL
ALT: 20 U/L (ref 14–54)
AST: 22 U/L (ref 15–41)
Albumin: 4.2 g/dL (ref 3.5–5.0)
Alkaline Phosphatase: 48 U/L (ref 38–126)
Anion gap: 9 (ref 5–15)
BUN: 12 mg/dL (ref 6–20)
CO2: 25 mmol/L (ref 22–32)
Calcium: 9.6 mg/dL (ref 8.9–10.3)
Chloride: 104 mmol/L (ref 101–111)
Creatinine, Ser: 0.99 mg/dL (ref 0.44–1.00)
GFR calc Af Amer: >60 mL/min (ref >60)
GFR calc non Af Amer: >60 mL/min (ref >60)
Glucose, Bld: 91 mg/dL (ref 65–99)
Potassium: 3.8 mmol/L (ref 3.5–5.1)
Sodium: 138 mmol/L (ref 135–145)
Total Bilirubin: 0.6 mg/dL (ref 0.3–1.2)
Total Protein: 8 g/dL (ref 6.5–8.1)
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
## 2015-06-06 LAB — PROTEIN S, TOTAL AND FREE
Protein S Ag, Free: 62 % (ref 57–157)
Protein S Ag, Total: 130 % (ref 60–150)

## 2015-06-06 LAB — PROTEIN C, TOTAL: Protein C, Total: 98 % (ref 60–150)

## 2015-09-04 ENCOUNTER — Encounter: Payer: Self-pay | Admitting: Hematology and Oncology

## 2015-09-13 ENCOUNTER — Ambulatory Visit: Payer: Self-pay | Attending: Oncology | Admitting: *Deleted

## 2015-09-13 ENCOUNTER — Ambulatory Visit
Admission: RE | Admit: 2015-09-13 | Discharge: 2015-09-13 | Disposition: A | Discharge status: Home / Self Care | Payer: Self-pay | Source: Ambulatory Visit | Attending: Oncology | Admitting: Oncology

## 2015-09-13 ENCOUNTER — Encounter: Payer: Self-pay | Admitting: *Deleted

## 2015-09-13 VITALS — BP 148/102 | HR 68 | Temp 97.6°F | Resp 20 | Ht 66.54 in | Wt 211.6 lb

## 2015-09-13 DIAGNOSIS — Z0000 Encounter for general adult medical examination without abnormal findings: Secondary | ICD-10-CM

## 2015-09-13 NOTE — Progress Notes (Signed)
Subjective:     Patient ID: Jessica Norton, female   DOB: 1961-06-14, 55 y.o.   MRN: [id number]  HPI   Review of Systems     Objective:   Physical Exam  Pulmonary/Chest: Right breast exhibits no inverted nipple, no mass, no nipple discharge, no skin change and no tenderness. Left breast exhibits no inverted nipple, no mass, no nipple discharge, no skin change and no tenderness. Breasts are asymmetrical.  Right breast approximately one cup size larger than the left breast       Assessment:     55 year old Black female returns to [facility] for annual screening.  Clinical breast exam unremarkable.  Taught self breast awareness.  Blood pressure elevated at 148/102.  She is to recheck her blood pressure at [facility] or [facility], and if remains higher than 140/90 she is to follow-up with her primary care provider.  Hand out on hypertention given to patient.  Patient has been screened for eligibility.  She does not have any insurance, Medicare or Medicaid.  She also meets financial eligibility.  Hand-out given on the Affordable Care Act.    Plan:     Screening mammogram ordered.  Will follow-up per protocol.

## 2015-09-13 NOTE — Patient Instructions (Signed)
Gave patient hand-out, Women Staying Healthy, Active and Well from [employer], with education on breast health, pap smears, heart and colon health.  Hypertension Hypertension, commonly called high blood pressure, is when the force of blood pumping through your arteries is too strong. Your arteries are the blood vessels that carry blood from your heart throughout your body. A blood pressure reading consists of a higher number over a lower number, such as 110/72. The higher number (systolic) is the pressure inside your arteries when your heart pumps. The lower number (diastolic) is the pressure inside your arteries when your heart relaxes. Ideally you want your blood pressure below 120/80. Hypertension forces your heart to work harder to pump blood. Your arteries may become narrow or stiff. Having untreated or uncontrolled hypertension can cause heart attack, stroke, kidney disease, and other problems. RISK FACTORS Some risk factors for high blood pressure are controllable. Others are not.  Risk factors you cannot control include:   Race. You may be at higher risk if you are African American.  Age. Risk increases with age.  Gender. Men are at higher risk than women before age 60 years. After age 3, women are at higher risk than men. Risk factors you can control include:  Not getting enough exercise or physical activity.  Being overweight.  Getting too much fat, sugar, calories, or salt in your diet.  Drinking too much alcohol. SIGNS AND SYMPTOMS Hypertension does not usually cause signs or symptoms. Extremely high blood pressure (hypertensive crisis) may cause headache, anxiety, shortness of breath, and nosebleed. DIAGNOSIS To check if you have hypertension, your health care provider will measure your blood pressure while you are seated, with your arm held at the level of your heart. It should be measured at least twice using the same arm. Certain conditions can cause a difference in blood  pressure between your right and left arms. A blood pressure reading that is higher than normal on one occasion does not mean that you need treatment. If it is not clear whether you have high blood pressure, you may be asked to return on a different day to have your blood pressure checked again. Or, you may be asked to monitor your blood pressure at home for 1 or more weeks. TREATMENT Treating high blood pressure includes making lifestyle changes and possibly taking medicine. Living a healthy lifestyle can help lower high blood pressure. You may need to change some of your habits. Lifestyle changes may include:  Following the DASH diet. This diet is high in fruits, vegetables, and whole grains. It is low in salt, red meat, and added sugars.  Keep your sodium intake below 2,300 mg per day.  Getting at least 30-45 minutes of aerobic exercise at least 4 times per week.  Losing weight if necessary.  Not smoking.  Limiting alcoholic beverages.  Learning ways to reduce stress. Your health care provider may prescribe medicine if lifestyle changes are not enough to get your blood pressure under control, and if one of the following is true:  You are 30-24 years of age and your systolic blood pressure is above 140.  You are 57 years of age or older, and your systolic blood pressure is above 150.  Your diastolic blood pressure is above 90.  You have diabetes, and your systolic blood pressure is over 140 or your diastolic blood pressure is over 90.  You have kidney disease and your blood pressure is above 140/90.  You have heart disease and your blood pressure  is above 140/90. Your personal target blood pressure may vary depending on your medical conditions, your age, and other factors. HOME CARE INSTRUCTIONS  Have your blood pressure rechecked as directed by your health care provider.   Take medicines only as directed by your health care provider. Follow the directions carefully. Blood  pressure medicines must be taken as prescribed. The medicine does not work as well when you skip doses. Skipping doses also puts you at risk for problems.  Do not smoke.   Monitor your blood pressure at home as directed by your health care provider. SEEK MEDICAL CARE IF:   You think you are having a reaction to medicines taken.  You have recurrent headaches or feel dizzy.  You have swelling in your ankles.  You have trouble with your vision. SEEK IMMEDIATE MEDICAL CARE IF:  You develop a severe headache or confusion.  You have unusual weakness, numbness, or feel faint.  You have severe chest or abdominal pain.  You vomit repeatedly.  You have trouble breathing. MAKE SURE YOU:   Understand these instructions.  Will watch your condition.  Will get help right away if you are not doing well or get worse.   This information is not intended to replace advice given to you by your health care provider. Make sure you discuss any questions you have with your health care provider.   Document Released: 07/08/2005 Document Revised: 11/22/2014 Document Reviewed: 04/30/2013 Elsevier Interactive Patient Education [employer]

## 2015-09-18 ENCOUNTER — Encounter: Payer: Self-pay | Admitting: *Deleted

## 2015-09-18 NOTE — Progress Notes (Signed)
Letter mailed from the [facility] to inform patient of her normal mammogram results.  Patient is to follow-up with annual screening in one year.  HSIS to [facility]. 

## 2015-09-30 ENCOUNTER — Other Ambulatory Visit: Payer: Self-pay

## 2015-09-30 DIAGNOSIS — I82512 Chronic embolism and thrombosis of left femoral vein: Secondary | ICD-10-CM

## 2015-10-06 ENCOUNTER — Inpatient Hospital Stay: Payer: Self-pay

## 2015-10-06 ENCOUNTER — Inpatient Hospital Stay: Payer: Self-pay | Admitting: Hematology and Oncology

## 2015-10-24 ENCOUNTER — Inpatient Hospital Stay: Payer: Self-pay | Admitting: Hematology and Oncology

## 2015-10-24 ENCOUNTER — Inpatient Hospital Stay: Payer: Self-pay

## 2015-11-03 ENCOUNTER — Inpatient Hospital Stay: Payer: Self-pay

## 2015-11-03 ENCOUNTER — Inpatient Hospital Stay: Payer: Self-pay | Admitting: Hematology and Oncology

## 2015-11-20 ENCOUNTER — Encounter: Payer: Self-pay | Admitting: Hematology and Oncology

## 2015-11-20 ENCOUNTER — Inpatient Hospital Stay: Payer: Self-pay | Attending: Hematology and Oncology

## 2015-11-20 ENCOUNTER — Inpatient Hospital Stay (HOSPITAL_BASED_OUTPATIENT_CLINIC_OR_DEPARTMENT_OTHER): Payer: Self-pay | Admitting: Hematology and Oncology

## 2015-11-20 DIAGNOSIS — E119 Type 2 diabetes mellitus without complications: Secondary | ICD-10-CM

## 2015-11-20 DIAGNOSIS — Z809 Family history of malignant neoplasm, unspecified: Secondary | ICD-10-CM

## 2015-11-20 DIAGNOSIS — Z803 Family history of malignant neoplasm of breast: Secondary | ICD-10-CM

## 2015-11-20 DIAGNOSIS — I10 Essential (primary) hypertension: Secondary | ICD-10-CM | POA: Insufficient documentation

## 2015-11-20 DIAGNOSIS — R760 Raised antibody titer: Secondary | ICD-10-CM

## 2015-11-20 DIAGNOSIS — Z87891 Personal history of nicotine dependence: Secondary | ICD-10-CM

## 2015-11-20 DIAGNOSIS — Z7984 Long term (current) use of oral hypoglycemic drugs: Secondary | ICD-10-CM | POA: Insufficient documentation

## 2015-11-20 DIAGNOSIS — E7800 Pure hypercholesterolemia, unspecified: Secondary | ICD-10-CM | POA: Insufficient documentation

## 2015-11-20 DIAGNOSIS — Z7901 Long term (current) use of anticoagulants: Secondary | ICD-10-CM

## 2015-11-20 DIAGNOSIS — Z79899 Other long term (current) drug therapy: Secondary | ICD-10-CM

## 2015-11-20 DIAGNOSIS — M79604 Pain in right leg: Secondary | ICD-10-CM

## 2015-11-20 DIAGNOSIS — F419 Anxiety disorder, unspecified: Secondary | ICD-10-CM | POA: Insufficient documentation

## 2015-11-20 DIAGNOSIS — Z86718 Personal history of other venous thrombosis and embolism: Secondary | ICD-10-CM | POA: Insufficient documentation

## 2015-11-20 DIAGNOSIS — M79605 Pain in left leg: Secondary | ICD-10-CM

## 2015-11-20 DIAGNOSIS — Z8673 Personal history of transient ischemic attack (TIA), and cerebral infarction without residual deficits: Secondary | ICD-10-CM | POA: Insufficient documentation

## 2015-11-20 DIAGNOSIS — I82512 Chronic embolism and thrombosis of left femoral vein: Secondary | ICD-10-CM

## 2015-11-20 LAB — CBC WITH DIFFERENTIAL/PLATELET
Basophils Absolute: 0 10*3/uL (ref 0–0.1)
Basophils Relative: 1 %
Eosinophils Absolute: 0 10*3/uL (ref 0–0.7)
Eosinophils Relative: 1 %
HCT: 40 % (ref 35.0–47.0)
Hemoglobin: 13.8 g/dL (ref 12.0–16.0)
Lymphocytes Relative: 43 %
Lymphs Abs: 1.8 10*3/uL (ref 1.0–3.6)
MCH: 30.2 pg (ref 26.0–34.0)
MCHC: 34.4 g/dL (ref 32.0–36.0)
MCV: 87.6 fL (ref 80.0–100.0)
Monocytes Absolute: 0.4 10*3/uL (ref 0.2–0.9)
Monocytes Relative: 9 %
Neutro Abs: 2 10*3/uL (ref 1.4–6.5)
Neutrophils Relative %: 46 %
Platelets: 267 10*3/uL (ref 150–440)
RBC: 4.56 MIL/uL (ref 3.80–5.20)
RDW: 16.1 % — ABNORMAL HIGH (ref 11.5–14.5)
WBC: 4.3 10*3/uL (ref 3.6–11.0)

## 2015-11-20 LAB — COMPREHENSIVE METABOLIC PANEL
ALT: 51 U/L (ref 14–54)
AST: 42 U/L — ABNORMAL HIGH (ref 15–41)
Albumin: 4.2 g/dL (ref 3.5–5.0)
Alkaline Phosphatase: 73 U/L (ref 38–126)
Anion gap: 5 (ref 5–15)
BUN: 13 mg/dL (ref 6–20)
CO2: 27 mmol/L (ref 22–32)
Calcium: 10.1 mg/dL (ref 8.9–10.3)
Chloride: 106 mmol/L (ref 101–111)
Creatinine, Ser: 0.93 mg/dL (ref 0.44–1.00)
GFR calc Af Amer: >60 mL/min (ref >60)
GFR calc non Af Amer: >60 mL/min (ref >60)
Glucose, Bld: 110 mg/dL — ABNORMAL HIGH (ref 65–99)
Potassium: 3.9 mmol/L (ref 3.5–5.1)
Sodium: 138 mmol/L (ref 135–145)
Total Bilirubin: 0.6 mg/dL (ref 0.3–1.2)
Total Protein: 8.4 g/dL — ABNORMAL HIGH (ref 6.5–8.1)

## 2015-11-20 MED ORDER — RIVAROXABAN 20 MG PO TABS: 20.0000 mg | ORAL_TABLET | Freq: Every day | ORAL | Status: AC
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
## 2015-11-20 NOTE — Progress Notes (Signed)
Pt reports a shooting constant pain in both legs.  Pt will [facility] doctor [facility] May [facility].  No other concerns at this time other than that leg.

## 2015-11-21 ENCOUNTER — Other Ambulatory Visit: Payer: Self-pay | Admitting: *Deleted

## 2015-11-21 DIAGNOSIS — R760 Raised antibody titer: Secondary | ICD-10-CM

## 2015-11-21 DIAGNOSIS — I82402 Acute embolism and thrombosis of unspecified deep veins of left lower extremity: Secondary | ICD-10-CM

## 2015-11-21 LAB — CARDIOLIPIN ANTIBODIES, IGG, IGM, IGA
Anticardiolipin IgA: <9 APL U/mL (ref 0–11)
Anticardiolipin IgG: <9 GPL U/mL (ref 0–14)
Anticardiolipin IgM: 15 MPL U/mL — ABNORMAL HIGH (ref 0–12)

## 2015-11-21 LAB — ANA W/REFLEX: Anti Nuclear Antibody(ANA): NEGATIVE

## 2016-01-09 ENCOUNTER — Telehealth: Payer: Self-pay | Admitting: *Deleted
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
## 2016-01-09 NOTE — Telephone Encounter (Signed)
Pt called stating that she got a letter that states that [employer] has not rcvd paperwork yet for her xarelto.  I told her that i would call and check it out and call her back.  [place] to [place] [employer].  She states it was approved today and she can take her existing copay card and use it. I have tried to call and tell pt it has been approved but she does not answer the phone and left a message with above.
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
## 2016-02-20 ENCOUNTER — Encounter (INDEPENDENT_AMBULATORY_CARE_PROVIDER_SITE_OTHER): Payer: Self-pay

## 2016-02-20 ENCOUNTER — Inpatient Hospital Stay: Payer: Self-pay

## 2016-02-20 ENCOUNTER — Encounter: Payer: Self-pay | Admitting: Hematology and Oncology

## 2016-02-20 ENCOUNTER — Inpatient Hospital Stay: Payer: Self-pay | Attending: Hematology and Oncology | Admitting: Hematology and Oncology

## 2016-02-20 VITALS — BP 166/107 | HR 64 | Temp 97.1°F

## 2016-02-20 DIAGNOSIS — I10 Essential (primary) hypertension: Secondary | ICD-10-CM

## 2016-02-20 DIAGNOSIS — Z79899 Other long term (current) drug therapy: Secondary | ICD-10-CM

## 2016-02-20 DIAGNOSIS — R7989 Other specified abnormal findings of blood chemistry: Secondary | ICD-10-CM

## 2016-02-20 DIAGNOSIS — R779 Abnormality of plasma protein, unspecified: Secondary | ICD-10-CM

## 2016-02-20 DIAGNOSIS — R760 Raised antibody titer: Secondary | ICD-10-CM

## 2016-02-20 DIAGNOSIS — E7800 Pure hypercholesterolemia, unspecified: Secondary | ICD-10-CM

## 2016-02-20 DIAGNOSIS — E119 Type 2 diabetes mellitus without complications: Secondary | ICD-10-CM

## 2016-02-20 DIAGNOSIS — E8809 Other disorders of plasma-protein metabolism, not elsewhere classified: Secondary | ICD-10-CM

## 2016-02-20 DIAGNOSIS — Z7901 Long term (current) use of anticoagulants: Secondary | ICD-10-CM

## 2016-02-20 DIAGNOSIS — I82512 Chronic embolism and thrombosis of left femoral vein: Secondary | ICD-10-CM

## 2016-02-20 DIAGNOSIS — Z7984 Long term (current) use of oral hypoglycemic drugs: Secondary | ICD-10-CM

## 2016-02-20 DIAGNOSIS — I82402 Acute embolism and thrombosis of unspecified deep veins of left lower extremity: Secondary | ICD-10-CM

## 2016-02-20 DIAGNOSIS — Z87891 Personal history of nicotine dependence: Secondary | ICD-10-CM

## 2016-02-20 DIAGNOSIS — Z86718 Personal history of other venous thrombosis and embolism: Secondary | ICD-10-CM

## 2016-02-20 DIAGNOSIS — Z8673 Personal history of transient ischemic attack (TIA), and cerebral infarction without residual deficits: Secondary | ICD-10-CM

## 2016-02-20 DIAGNOSIS — D6861 Antiphospholipid syndrome: Secondary | ICD-10-CM

## 2016-02-20 DIAGNOSIS — F419 Anxiety disorder, unspecified: Secondary | ICD-10-CM

## 2016-02-20 DIAGNOSIS — M4800 Spinal stenosis, site unspecified: Secondary | ICD-10-CM

## 2016-02-20 LAB — COMPREHENSIVE METABOLIC PANEL
ALT: 24 U/L (ref 14–54)
AST: 22 U/L (ref 15–41)
Albumin: 4.5 g/dL (ref 3.5–5.0)
Alkaline Phosphatase: 72 U/L (ref 38–126)
Anion gap: 9 (ref 5–15)
BUN: 13 mg/dL (ref 6–20)
CO2: 24 mmol/L (ref 22–32)
Calcium: 9.9 mg/dL (ref 8.9–10.3)
Chloride: 102 mmol/L (ref 101–111)
Creatinine, Ser: 1 mg/dL (ref 0.44–1.00)
GFR calc Af Amer: >60 mL/min (ref >60)
GFR calc non Af Amer: >60 mL/min (ref >60)
Glucose, Bld: 98 mg/dL (ref 65–99)
Potassium: 3.7 mmol/L (ref 3.5–5.1)
Sodium: 135 mmol/L (ref 135–145)
Total Bilirubin: 0.7 mg/dL (ref 0.3–1.2)
Total Protein: 8.8 g/dL — ABNORMAL HIGH (ref 6.5–8.1)

## 2016-02-20 LAB — CBC WITH DIFFERENTIAL/PLATELET
Basophils Absolute: 0.1 10*3/uL (ref 0–0.1)
Basophils Relative: 1 %
Eosinophils Absolute: 0 10*3/uL (ref 0–0.7)
Eosinophils Relative: 1 %
HCT: 43.5 % (ref 35.0–47.0)
Hemoglobin: 15 g/dL (ref 12.0–16.0)
Lymphocytes Relative: 38 %
Lymphs Abs: 2.2 10*3/uL (ref 1.0–3.6)
MCH: 29.9 pg (ref 26.0–34.0)
MCHC: 34.5 g/dL (ref 32.0–36.0)
MCV: 86.6 fL (ref 80.0–100.0)
Monocytes Absolute: 0.6 10*3/uL (ref 0.2–0.9)
Monocytes Relative: 10 %
Neutro Abs: 2.9 10*3/uL (ref 1.4–6.5)
Neutrophils Relative %: 50 %
Platelets: 307 10*3/uL (ref 150–440)
RBC: 5.02 MIL/uL (ref 3.80–5.20)
RDW: 15.5 % — ABNORMAL HIGH (ref 11.5–14.5)
WBC: 5.7 10*3/uL (ref 3.6–11.0)

## 2016-02-20 NOTE — Progress Notes (Signed)
Patient has extremely elevated BP today. She has not taken her meds for two days. Advised her to take medication today and monitor BP twice a day until is stable. Notify PCP if still elevated. Has persistent.  cough since starting on lisinopril.

## 2016-02-21 ENCOUNTER — Other Ambulatory Visit: Payer: Self-pay | Admitting: *Deleted

## 2016-02-21 DIAGNOSIS — I82512 Chronic embolism and thrombosis of left femoral vein: Secondary | ICD-10-CM

## 2016-02-21 DIAGNOSIS — R760 Raised antibody titer: Secondary | ICD-10-CM

## 2016-02-21 LAB — KAPPA/LAMBDA LIGHT CHAINS
Kappa free light chain: 13.5 mg/L (ref 3.3–19.4)
Kappa, lambda light chain ratio: 1.03 (ref 0.26–1.65)
Lambda free light chains: 13.1 mg/L (ref 5.7–26.3)

## 2016-02-21 LAB — PROTEIN ELECTROPHORESIS, SERUM
A/G Ratio: 1.1 (ref 0.7–1.7)
Albumin ELP: 3.8 g/dL (ref 2.9–4.4)
Alpha-1-Globulin: 0.2 g/dL (ref 0.0–0.4)
Alpha-2-Globulin: 0.9 g/dL (ref 0.4–1.0)
Beta Globulin: 1.2 g/dL (ref 0.7–1.3)
Gamma Globulin: 1.2 g/dL (ref 0.4–1.8)
Globulin, Total: 3.6 g/dL (ref 2.2–3.9)
Total Protein ELP: 7.4 g/dL (ref 6.0–8.5)

## 2016-05-24 ENCOUNTER — Inpatient Hospital Stay: Payer: Self-pay

## 2016-07-03 ENCOUNTER — Inpatient Hospital Stay: Payer: Self-pay | Attending: Hematology and Oncology

## 2016-07-03 ENCOUNTER — Other Ambulatory Visit: Payer: Self-pay | Admitting: Hematology and Oncology

## 2016-07-03 ENCOUNTER — Inpatient Hospital Stay (HOSPITAL_BASED_OUTPATIENT_CLINIC_OR_DEPARTMENT_OTHER): Payer: Self-pay | Admitting: Hematology and Oncology

## 2016-07-03 ENCOUNTER — Encounter: Payer: Self-pay | Admitting: Hematology and Oncology

## 2016-07-03 DIAGNOSIS — Z7901 Long term (current) use of anticoagulants: Secondary | ICD-10-CM

## 2016-07-03 DIAGNOSIS — Z8679 Personal history of other diseases of the circulatory system: Secondary | ICD-10-CM

## 2016-07-03 DIAGNOSIS — Z1159 Encounter for screening for other viral diseases: Secondary | ICD-10-CM | POA: Insufficient documentation

## 2016-07-03 DIAGNOSIS — M25561 Pain in right knee: Secondary | ICD-10-CM

## 2016-07-03 DIAGNOSIS — Z87891 Personal history of nicotine dependence: Secondary | ICD-10-CM | POA: Insufficient documentation

## 2016-07-03 DIAGNOSIS — G8929 Other chronic pain: Secondary | ICD-10-CM | POA: Insufficient documentation

## 2016-07-03 DIAGNOSIS — E7800 Pure hypercholesterolemia, unspecified: Secondary | ICD-10-CM | POA: Insufficient documentation

## 2016-07-03 DIAGNOSIS — Z7984 Long term (current) use of oral hypoglycemic drugs: Secondary | ICD-10-CM

## 2016-07-03 DIAGNOSIS — Z86718 Personal history of other venous thrombosis and embolism: Secondary | ICD-10-CM | POA: Insufficient documentation

## 2016-07-03 DIAGNOSIS — Z8673 Personal history of transient ischemic attack (TIA), and cerebral infarction without residual deficits: Secondary | ICD-10-CM | POA: Insufficient documentation

## 2016-07-03 DIAGNOSIS — M25562 Pain in left knee: Secondary | ICD-10-CM | POA: Insufficient documentation

## 2016-07-03 DIAGNOSIS — I10 Essential (primary) hypertension: Secondary | ICD-10-CM

## 2016-07-03 DIAGNOSIS — Z79899 Other long term (current) drug therapy: Secondary | ICD-10-CM

## 2016-07-03 DIAGNOSIS — E119 Type 2 diabetes mellitus without complications: Secondary | ICD-10-CM

## 2016-07-03 DIAGNOSIS — Z809 Family history of malignant neoplasm, unspecified: Secondary | ICD-10-CM | POA: Insufficient documentation

## 2016-07-03 DIAGNOSIS — R760 Raised antibody titer: Secondary | ICD-10-CM

## 2016-07-03 LAB — COMPREHENSIVE METABOLIC PANEL WITH GFR
ALT: 28 U/L (ref 14–54)
AST: 26 U/L (ref 15–41)
Albumin: 4.2 g/dL (ref 3.5–5.0)
Alkaline Phosphatase: 69 U/L (ref 38–126)
Anion gap: 9 (ref 5–15)
BUN: 12 mg/dL (ref 6–20)
CO2: 27 mmol/L (ref 22–32)
Calcium: 9.9 mg/dL (ref 8.9–10.3)
Chloride: 103 mmol/L (ref 101–111)
Creatinine, Ser: 1.33 mg/dL — ABNORMAL HIGH (ref 0.44–1.00)
GFR calc Af Amer: 52 mL/min — ABNORMAL LOW
GFR calc non Af Amer: 45 mL/min — ABNORMAL LOW
Glucose, Bld: 103 mg/dL — ABNORMAL HIGH (ref 65–99)
Potassium: 3.8 mmol/L (ref 3.5–5.1)
Sodium: 139 mmol/L (ref 135–145)
Total Bilirubin: 0.3 mg/dL (ref 0.3–1.2)
Total Protein: 7.9 g/dL (ref 6.5–8.1)

## 2016-07-03 LAB — CBC WITH DIFFERENTIAL/PLATELET
Basophils Absolute: 0 10*3/uL (ref 0–0.1)
Basophils Relative: 1 %
Eosinophils Absolute: 0.1 10*3/uL (ref 0–0.7)
Eosinophils Relative: 2 %
HCT: 41.4 % (ref 35.0–47.0)
HEMOGLOBIN: 13.8 g/dL (ref 12.0–16.0)
LYMPHS ABS: 1.9 10*3/uL (ref 1.0–3.6)
LYMPHS PCT: 42 %
MCH: 29.3 pg (ref 26.0–34.0)
MCHC: 33.3 g/dL (ref 32.0–36.0)
MCV: 87.8 fL (ref 80.0–100.0)
MONOS PCT: 9 %
Monocytes Absolute: 0.4 10*3/uL (ref 0.2–0.9)
NEUTROS PCT: 46 %
Neutro Abs: 2.1 10*3/uL (ref 1.4–6.5)
Platelets: 288 10*3/uL (ref 150–440)
RBC: 4.71 MIL/uL (ref 3.80–5.20)
RDW: 15 % — ABNORMAL HIGH (ref 11.5–14.5)
WBC: 4.5 10*3/uL (ref 3.6–11.0)

## 2016-07-03 NOTE — Assessment & Plan Note (Signed)
This is considered insignificant. She does not need further follow-up
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
## 2016-07-03 NOTE — Assessment & Plan Note (Signed)
She has chronic degenerative knee pain from osteoarthritis. I recommend high-dose vitamin D supplement. As discussed, if she needs knee surgery in the future, she would need aggressive DVT prophylaxis

## 2016-07-03 NOTE — Assessment & Plan Note (Signed)
She has elevated total protein. Autoimmune screen and myeloma screening tests were negative. I will order hepatitis C screening test to exclude chronic hepatitis C infection. I suspect the result will be negative

## 2016-07-03 NOTE — Progress Notes (Signed)
[facility] FOLLOW-UP progress notes  Patient Care Team: Kelli Churnosanne Tiller, MD as PCP - General (Student) Jim LikeSheena M Lambert, RN as Registered Nurse Scarlett PrestoAnne F Shaver, RN as Registered Nurse  CHIEF COMPLAINTS/PURPOSE OF VISIT:  History of left lower extremity DVT and remote history of TIA  HISTORY OF PRESENTING ILLNESS:  Jessica GimenezSandra C Norton 55 y.o. female was transferred to my care after her prior physician is not Norton.  I reviewed the patient's records extensive and collaborated the history with the patient. Summary of her history is as follows: This patient used to smoke. She had remote history of TIA. She has other cardiovascular risk factors including diabetes, hypertension and hypercholesterolemia. She recalled having had to sit in long car ride prior to the diagnosis of DVT. On 04/20/2013, she complained of left leg pain. Ultrasound venous Doppler showed nonocclusive deep venous thrombosis of the left superficial femoral vein.  She had extensive workup including thrombophilia panel. Her anti-cardiolipin anti-body was mildly elevated. Other workup were negative. The patient quit smoking in 2016. The patient denies any recent signs or symptoms of bleeding such as spontaneous epistaxis, hematuria or hematochezia. She complained of chronic bilateral knee pain. She is in the process of getting disability. She is currently not working. She anticipate possible future knee replacement surgery  MEDICAL HISTORY:  Past Medical History:  Diagnosis Date  . Allergy   . Diabetes mellitus without complication (HCC)   . DVT (deep venous thrombosis) (HCC)   . High cholesterol   . Hypertension   . TIA (transient ischemic attack) 2013    SURGICAL HISTORY: Past Surgical History:  Procedure Laterality Date  . removed tumor from back  2005  . SHOULDER SURGERY  2006 both shoulders  . thumb surgery  2009  . vocal chord surrgery  2013    SOCIAL HISTORY: Social History   Social History   . Marital status: Married    Spouse name: N/A  . Number of children: N/A  . Years of education: N/A   Occupational History  . Not on file.   Social History Main Topics  . Smoking status: Former Smoker    Packs/day: 0.50    Years: 20.00    Types: Cigarettes    Quit date: 12/14/2014  . Smokeless tobacco: Never Used  . Alcohol use 1.2 oz/week    2 Glasses of wine per week  . Drug use: No  . Sexual activity: Not on file   Other Topics Concern  . Not on file   Social History Narrative  . No narrative on file    FAMILY HISTORY: Family History  Problem Relation Age of Onset  . Cancer Paternal Grandfather   . Breast cancer Neg Hx     ALLERGIES:  has No Known Allergies.  MEDICATIONS:  Current Outpatient Prescriptions  Medication Sig Dispense Refill  . atorvastatin (LIPITOR) 20 MG tablet Take 20 mg by mouth.    . gabapentin (NEURONTIN) 100 MG capsule Take 100 mg by mouth.    Marland Kitchen. lisinopril (PRINIVIL,ZESTRIL) 10 MG tablet Take 10 mg by mouth.    . rivaroxaban (XARELTO) 20 MG TABS tablet Take 1 tablet (20 mg total) by mouth daily with supper. 30 tablet 5  . traMADol (ULTRAM) 50 MG tablet Take 50 mg by mouth.    . metFORMIN (GLUCOPHAGE) 500 MG tablet Take 500 mg by mouth.     No current facility-administered medications for this visit.     REVIEW OF SYSTEMS:   Constitutional: Denies fevers,  chills or abnormal night sweats Eyes: Denies blurriness of vision, double vision or watery eyes Ears, nose, mouth, throat, and face: Denies mucositis or sore throat Respiratory: Denies cough, dyspnea or wheezes Cardiovascular: Denies palpitation, chest discomfort or lower extremity swelling Gastrointestinal:  Denies nausea, heartburn or change in bowel habits Skin: Denies abnormal skin rashes Lymphatics: Denies new lymphadenopathy or easy bruising Neurological:Denies numbness, tingling or new weaknesses Behavioral/Psych: Mood is stable, no new changes  All other systems were reviewed  with the patient and are negative.  PHYSICAL EXAMINATION: ECOG PERFORMANCE STATUS: 1 - Symptomatic but completely ambulatory  Vitals:   07/03/16 1430  BP: (!) 144/98  Pulse: 78  Resp: 18  Temp: 97.9 F (36.6 C)   Filed Weights   07/03/16 1430  Weight: 210 lb 8.6 oz (95.5 kg)    GENERAL:alert, no distress and comfortable SKIN: skin color, texture, turgor are normal, no rashes or significant lesions EYES: normal, conjunctiva are pink and non-injected, sclera clear Musculoskeletal:no cyanosis of digits and no clubbing  PSYCH: alert & oriented x 3 with fluent speech NEURO: no focal motor/sensory deficits  LABORATORY DATA:  I have reviewed the data as listed Lab Results  Component Value Date   WBC 4.5 07/03/2016   HGB 13.8 07/03/2016   HCT 41.4 07/03/2016   MCV 87.8 07/03/2016   PLT 288 07/03/2016    Recent Labs  11/20/15 0948 02/20/16 0932 07/03/16 1410  NA 138 135 139  K 3.9 3.7 3.8  CL 106 102 103  CO2 27 24 27   GLUCOSE 110* 98 103*  BUN 13 13 12   CREATININE 0.93 1.00 1.33*  CALCIUM 10.1 9.9 9.9  GFRNONAA >60 >60 45*  GFRAA >60 >60 52*  PROT 8.4* 8.8* 7.9  ALBUMIN 4.2 4.5 4.2  AST 42* 22 26  ALT 51 24 28  ALKPHOS 73 72 69  BILITOT 0.6 0.7 0.3    ASSESSMENT & PLAN:  History of DVT (deep vein thrombosis) I reviewed her records extensively. She had provoked DVT It was likely provoked by history of smoking and long car rides. She has quit smoking and no longer have long car rides. She does not need to remain on Xarelto long-term. As soon as her current prescription runs out, I recommend she switches over to aspirin. I shared with the patient publication in the NEJM in 2012.  Aspirin for Preventing the Recurrence of Venous Thromboembolism Benay Pike, M.D., Ph.D., Chrisandra Netters, M.D., Lonia Farber, M.D., Vallery Ridge, M.D., Milas Hock, M.D., Yetta Flock, M.D., Veronda Prude, M.D., Roni Bread, M.D., Cardell Peach, M.D.,  Consuello Bossier, M.D., Charleston Ropes, M.D., and Ileana Roup, M.D., Ph.D., for the [facility] Investigators* Malva Limes Med 2012; 366:1959-1967May 24, 2012DOI: 10.1056/[id number]  The aim of the Aspirin for the Prevention of Recurrent Venous Thromboembolism (the Warfarin and Aspirin [WARFASA]) study was to assess the clinical benefit of aspirin for the prevention of recurrence after a course of treatment with vitamin K antagonists in patients with unprovoked venous thromboembolism.  WARFASA was a multicenter, investigator-initiated, randomized, double-blind clinical trial. Eligible patients were randomly assigned to aspirin, 100 mg once daily, or placebo for 2 years, with the option of extending the study treatment. Randomization occurred within 2 weeks after vitamin K antagonists had been withdrawn.  Venous thromboembolism recurred in 28 of the 205 patients who received aspirin and in 43 of the 197 patients who received placebo (6.6% vs. 11.2% per year; hazard ratio, 0.58; 95% confidence interval [CI], 0.36 to 0.93) (median study  period, 24.6 months). During a median treatment period of 23.9 months, 23 patients taking aspirin and 39 taking placebo had a recurrence (5.9% vs. 11.0% per year; hazard ratio, 0.55; 95% CI, 0.33 to 0.92). One patient in each treatment group had a major bleeding episode. Adverse events were similar in the two groups.  Aspirin reduced the risk of recurrence when given to patients with unprovoked venous thromboembolism who had discontinued anticoagulant treatment, with no apparent increase in the risk of major bleeding  As aspirin is not Norton in 100 mg dose I would recommend using 2 tablets of 81 mg aspirin daily to be taken with food to minimize GI upset. Duration of treatment is indefinitely  Her prior thrombophilia disorder workup was negative. The elevated anti-Cardiolipin antibody is considered nonsignificant. We discussed some of the risk factors related to  increased risk of blood clots such as major surgeries, immobility, as surgeon replacement therapy, smoking, etc. The patient may have major knee surgery in the future. She would need aggressive DVT prophylaxis perioperatively.   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 She does not need further follow-up  Need for hepatitis C screening test She has elevated total protein. Autoimmune screen and myeloma screening tests were negative. I will order hepatitis C screening test to exclude chronic hepatitis C infection. I suspect the result will be negative  Bilateral chronic knee pain She has chronic degenerative knee pain from osteoarthritis. I recommend high-dose vitamin D supplement. As discussed, if she needs knee surgery in the future, she would need aggressive DVT prophylaxis   No orders of the defined types were placed in this encounter.   All questions were answered. The patient knows to call the clinic with any problems, questions or concerns. I spent 20 minutes counseling the patient face to face. The total time spent in the appointment was 30 minutes and more than 50% was on counseling.     Artis DelayNi Tempest Frankland, MD 07/03/2016 3:13 PM

## 2016-07-04 LAB — HEPATITIS C ANTIBODY (REFLEX): HCV Ab: 0.2 s/co ratio (ref 0.0–0.9)

## 2016-07-04 LAB — HCV COMMENT:

## 2018-07-31 ENCOUNTER — Emergency Department
Admission: EM | Admit: 2018-07-31 | Discharge: 2018-07-31 | Disposition: A | Discharge status: Home / Self Care | Payer: Medicare Other | Attending: Emergency Medicine | Admitting: Emergency Medicine

## 2018-07-31 ENCOUNTER — Emergency Department: Payer: Medicare Other

## 2018-07-31 ENCOUNTER — Other Ambulatory Visit: Payer: Self-pay

## 2018-07-31 DIAGNOSIS — E785 Hyperlipidemia, unspecified: Secondary | ICD-10-CM | POA: Diagnosis not present

## 2018-07-31 DIAGNOSIS — I10 Essential (primary) hypertension: Secondary | ICD-10-CM | POA: Diagnosis not present

## 2018-07-31 DIAGNOSIS — R079 Chest pain, unspecified: Secondary | ICD-10-CM | POA: Diagnosis present

## 2018-07-31 DIAGNOSIS — Z87891 Personal history of nicotine dependence: Secondary | ICD-10-CM | POA: Insufficient documentation

## 2018-07-31 DIAGNOSIS — E119 Type 2 diabetes mellitus without complications: Secondary | ICD-10-CM | POA: Diagnosis not present

## 2018-07-31 LAB — CBC
HCT: 43.2 % (ref 36.0–46.0)
HEMOGLOBIN: 13.8 g/dL (ref 12.0–15.0)
MCH: 28.7 pg (ref 26.0–34.0)
MCHC: 31.9 g/dL (ref 30.0–36.0)
MCV: 89.8 fL (ref 80.0–100.0)
Platelets: 287 10*3/uL (ref 150–400)
RBC: 4.81 MIL/uL (ref 3.87–5.11)
RDW: 15.1 % (ref 11.5–15.5)
WBC: 3.7 10*3/uL — ABNORMAL LOW (ref 4.0–10.5)
nRBC: 0 % (ref 0.0–0.2)

## 2018-07-31 LAB — TROPONIN I
Troponin I: 0.03 ng/mL (ref <0.03)
Troponin I: 0.03 ng/mL (ref <0.03)

## 2018-07-31 LAB — BASIC METABOLIC PANEL
Anion gap: 8 (ref 5–15)
BUN: 16 mg/dL (ref 6–20)
CO2: 26 mmol/L (ref 22–32)
Calcium: 9.9 mg/dL (ref 8.9–10.3)
Chloride: 104 mmol/L (ref 98–111)
Creatinine, Ser: 0.91 mg/dL (ref 0.44–1.00)
GFR calc Af Amer: >60 mL/min (ref >60)
Glucose, Bld: 98 mg/dL (ref 70–99)
Potassium: 3.5 mmol/L (ref 3.5–5.1)
Sodium: 138 mmol/L (ref 135–145)

## 2018-07-31 MED ORDER — ASPIRIN 81 MG PO CHEW
324.0000 mg | CHEWABLE_TABLET | Freq: Once | ORAL | Status: AC
  Administered 2018-07-31: 324 mg via ORAL
  Filled 2018-07-31: qty 4

## 2018-07-31 MED ORDER — ALUM & MAG HYDROXIDE-SIMETH 200-200-20 MG/5ML PO SUSP
30.0000 mL | Freq: Once | ORAL | Status: AC
  Administered 2018-07-31: 30 mL via ORAL
  Filled 2018-07-31 (×2): qty 30

## 2018-07-31 NOTE — ED Notes (Signed)
Collected red top, blue top, lt green top, lav top, sent to lab pending orders.

## 2018-07-31 NOTE — Discharge Instructions (Signed)
Please make an appointment to follow-up with Dr. end, the cardiologist on-call, for further evaluation.  Return to the emergency department if you develop chest pain, lightheadedness or fainting, palpitations, shortness of breath, swelling in the legs, sweatiness or clammy feeling, nausea or vomiting, or any other symptoms concerning to you.

## 2018-07-31 NOTE — ED Notes (Signed)

## 2018-07-31 NOTE — ED Provider Notes (Signed)
[facility] Emergency Department Provider Note  ____________________________________________  Time seen: Approximately 3:24 PM  I have reviewed the triage vital signs and the nursing notes.   HISTORY  Chief Complaint Chest Pain    HPI Jessica Norton is a 58 y.o. female with a history of hypertension, hyperlipidemia, diet-controlled diabetes, TIA, presenting with central chest pain.  The patient reports that prior to eating lunch at noon today, she had the onset of a "sharp" nonradiating pain just below the sternum that has persisted.  She did not have any associated shortness of breath, lightheadedness or syncope, nausea or vomiting, palpitations.  She has not been having any exertional chest pain over the past several days.  She has not tried anything for pain.  Her last stress test was in 2014 and was reportedly negative.  SH: Tobacco 5 days ago; no cocaine.  FH: No family history of early onset CAD.  Past Medical History:  Diagnosis Date  . Allergy   . Diabetes mellitus without complication (HCC)   . DVT (deep venous thrombosis) (HCC)   . High cholesterol   . Hypertension   . TIA (transient ischemic attack) 2013    Patient Active Problem List   Diagnosis Date Noted  . Need for hepatitis C screening test 07/03/2016  . History of DVT (deep vein thrombosis) 07/03/2016  . Bilateral chronic knee pain 07/03/2016  . 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 Acute deep vein thrombosis (DVT) of left lower extremity (HCC) 04/14/2013  . Deep venous thrombosis of left femoral vein (HCC) 03/24/2013  . CVA (cerebral infarction) 07/23/2011    Past Surgical History:  Procedure Laterality Date  . removed tumor from back  2005  . SHOULDER SURGERY  2006 both shoulders  . thumb surgery  2009  . vocal chord surrgery  2013    Current Outpatient Rx  . Order #: [id number] Class: Historical Med  . Order #: [id number] Class: Historical Med  . Order #:  [id number] Class: Historical Med  . Order #: [id number] Class: Historical Med  . Order #: [id number] Class: Normal  . Order #: [id number] Class: Historical Med    Allergies Patient has no known allergies.  Family History  Problem Relation Age of Onset  . Cancer Paternal Grandfather   . Breast cancer Neg Hx     Social History Social History   Tobacco Use  . Smoking status: Former Smoker    Packs/day: 0.50    Years: 20.00    Pack years: 10.00    Types: Cigarettes    Last attempt to quit: 12/14/2014    Years since quitting: 3.6  . Smokeless tobacco: Never Used  Substance Use Topics  . Alcohol use: Yes    Alcohol/week: 2.0 standard drinks    Types: 2 Glasses of wine per week  . Drug use: No    Review of Systems Constitutional: No fever/chills.  No lightheadedness or syncope. Eyes: No visual changes.  No diaphoresis. ENT: No sore throat. No congestion or rhinorrhea. Cardiovascular: Positive central chest pain. Denies palpitations. Respiratory: Denies shortness of breath.  No cough. Gastrointestinal: No abdominal pain.  No nausea, no vomiting.  No diarrhea.  No constipation. Genitourinary: Negative for dysuria. Musculoskeletal: Negative for back pain.  Lower extremity swelling or calf pain. Skin: Negative for rash. Neurological: Negative for headaches. No focal numbness, tingling or weakness.     ____________________________________________   PHYSICAL EXAM:  VITAL SIGNS: ED Triage Vitals  Enc Vitals Group     BP 07/31/18 1250 (!) 171/101  Pulse Rate 07/31/18 1250 66     Resp 07/31/18 1250 15     Temp 07/31/18 1250 97.9 F (36.6 C)     Temp Source 07/31/18 1250 Oral     SpO2 07/31/18 1250 100 %     Weight 07/31/18 1306 205 lb (93 kg)     Height 07/31/18 1306 5\' 4"  (1.626 m)     Head Circumference --      Peak Flow --      Pain Score 07/31/18 1306 6     Pain Loc --      Pain Edu? --      Excl. in GC? --     Constitutional: Alert and oriented. Answers  questions appropriately. Eyes: Conjunctivae are normal.  EOMI. No scleral icterus. Head: Atraumatic. Nose: No congestion/rhinnorhea. Mouth/Throat: Mucous membranes are moist.  Neck: No stridor.  Supple.  No JVD.  No meningismus. Cardiovascular: Normal rate, regular rhythm. No murmurs, rubs or gallops.  Respiratory: Normal respiratory effort.  No accessory muscle use or retractions. Lungs CTAB.  No wheezes, rales or ronchi. Gastrointestinal: Soft, nontender and nondistended.  No guarding or rebound.  No peritoneal signs. Musculoskeletal: No LE edema. No ttp in the calves or palpable cords.  Negative Homan's sign. Neurologic:  A&Ox3.  Speech is clear.  Face and smile are symmetric.  EOMI.  Moves all extremities well. Skin:  Skin is warm, dry and intact. No rash noted. Psychiatric: Mood and affect are normal. Speech and behavior are normal.  Normal judgement.  ____________________________________________   LABS (all labs ordered are listed, but only abnormal results are displayed)  Labs Reviewed  CBC - Abnormal; Notable for the following components:      Result Value   WBC 3.7 (*)    All other components within normal limits  TROPONIN I - Abnormal; Notable for the following components:   Troponin I 0.03 (*)    All other components within normal limits  TROPONIN I - Abnormal; Notable for the following components:   Troponin I 0.03 (*)    All other components within normal limits  BASIC METABOLIC PANEL   ____________________________________________  EKG  ED ECG REPORT I, Anne-Caroline Sharma Covert, the attending physician, personally viewed and interpreted this ECG.   Date: 07/31/2018  EKG Time: 1250  Rate: 61  Rhythm: normal sinus rhythm  Axis: leftward  Intervals:none  ST&T Change: No STEMI  ____________________________________________  RADIOLOGY  Dg Chest 2 View  Result Date: 07/31/2018 CLINICAL DATA:  Chest pain. EXAM: CHEST - 2 VIEW COMPARISON:  05/25/2010 FINDINGS: The  heart size and mediastinal contours are within normal limits. Both lungs are clear. The visualized skeletal structures are unremarkable. IMPRESSION: No active cardiopulmonary disease. Electronically Signed   By: Francene Boyers M.D.   On: 07/31/2018 13:32    ____________________________________________   PROCEDURES  Procedure(s) performed: None  Procedures  Critical Care performed: No ____________________________________________   INITIAL IMPRESSION / ASSESSMENT AND PLAN / ED COURSE  Pertinent labs & imaging results that were available during my care of the patient were reviewed by me and considered in my medical decision making (see chart for details).  58 y.o. female with a history of hypertension, hyperlipidemia and diet-controlled diabetes, recent tobacco use, presenting with central chest pain while at rest.  Overall, the patient is hypertensive here, and we will recheck her blood pressure.  The patient does have significant risk factors for heart disease and has not had a stress test or wrist edification study in the  last 12 months.  However, her presentation today is atypical for ACS or MI and her EKG does not show any ischemic changes.  Her first troponin is 0.03 and we will get a repeat.  GI cause for the patient's symptoms is possible.  I have considered PE or aortic pathology, which is unlikely.  The patient's chest x-ray is reassuring.  I will plan to treat her with a GI cocktail and reevaluate her pain.  Regardless of whether her pain has improved or not, she will be given cardioprotective aspirin today.  I have talked to her about admission versus outpatient stress testing depending on the results of her studies and her clinical course.  ----------------------------------------- 4:50 PM on 07/31/2018 -----------------------------------------  The patient's symptoms have resolved and her blood pressure has improved.  Her repeat troponin is unchanged.  At this time, the patient is  safe for discharge home but I have given her strict return precautions.  She will follow-up with cardiology for re-stratification study.   ____________________________________________  FINAL CLINICAL IMPRESSION(S) / ED DIAGNOSES  Final diagnoses:  Nonspecific chest pain         NEW MEDICATIONS STARTED DURING THIS VISIT:  New Prescriptions   No medications on file      Rockne MenghiniNorman, Anne-Caroline, MD 07/31/18 1653

## 2018-07-31 NOTE — ED Triage Notes (Signed)
Pt states that she was getting ready to eat and started having chest pain in the center of her chest, pt denies nausea, some worsening pain with movement, denies injury, denies sob

## 2019-10-08 ENCOUNTER — Ambulatory Visit: Payer: Medicare Other

## 2019-10-12 ENCOUNTER — Ambulatory Visit: Payer: Medicare Other | Attending: Internal Medicine

## 2019-10-12 DIAGNOSIS — Z23 Encounter for immunization: Secondary | ICD-10-CM

## 2019-10-12 NOTE — Progress Notes (Signed)
   Covid-19 Vaccination Clinic  Name:  MIKAELA HILGEMAN    MRN: [id number] DOB: [birth year]  10/12/2019  Ms. Hollywood was observed post Covid-19 immunization for 15 minutes without incident. She was provided with Vaccine Information Sheet and instruction to access the V-Safe system.   Ms. Pena was instructed to call 911 with any severe reactions post vaccine: Marland Kitchen Difficulty breathing  . Swelling of face and throat  . A fast heartbeat  . A bad rash all over body  . Dizziness and weakness   Immunizations Administered    Name Date Dose VIS Date Route   Pfizer COVID-19 Vaccine 10/12/2019 11:13 AM 0.3 mL 07/02/2019 Intramuscular   Manufacturer: [employer], [employer]   Lot: [id number]   NDC: [address]-0

## 2019-11-09 ENCOUNTER — Ambulatory Visit: Payer: Medicare Other | Attending: Internal Medicine

## 2019-11-09 DIAGNOSIS — Z23 Encounter for immunization: Secondary | ICD-10-CM

## 2019-11-09 NOTE — Progress Notes (Signed)
   Covid-19 Vaccination Clinic  Name:  Jessica Norton    MRN: [id number] DOB: 08/04/1960  11/09/2019  Jessica Norton was observed post Covid-19 immunization for 15 minutes without incident. She was provided with Vaccine Information Sheet and instruction to access the V-Safe system.   Jessica Norton was instructed to call 911 with any severe reactions post vaccine: Marland Kitchen Difficulty breathing  . Swelling of face and throat  . A fast heartbeat  . A bad rash all over body  . Dizziness and weakness   Immunizations Administered    Name Date Dose VIS Date Route   Pfizer COVID-19 Vaccine 11/09/2019  8:23 AM 0.3 mL 09/15/2018 Intramuscular   Manufacturer: [employer], [employer]   Lot: [id number]   NDC: [address]-0

## 2021-05-18 ENCOUNTER — Emergency Department: Payer: Medicare Other

## 2021-05-18 ENCOUNTER — Other Ambulatory Visit: Payer: Self-pay

## 2021-05-18 ENCOUNTER — Emergency Department
Admission: EM | Admit: 2021-05-18 | Discharge: 2021-05-18 | Disposition: A | Discharge status: Home / Self Care | Payer: Medicare Other | Attending: Emergency Medicine | Admitting: Emergency Medicine

## 2021-05-18 DIAGNOSIS — X509XXA Other and unspecified overexertion or strenuous movements or postures, initial encounter: Secondary | ICD-10-CM | POA: Diagnosis not present

## 2021-05-18 DIAGNOSIS — I10 Essential (primary) hypertension: Secondary | ICD-10-CM | POA: Insufficient documentation

## 2021-05-18 DIAGNOSIS — E119 Type 2 diabetes mellitus without complications: Secondary | ICD-10-CM | POA: Diagnosis not present

## 2021-05-18 DIAGNOSIS — Z87891 Personal history of nicotine dependence: Secondary | ICD-10-CM | POA: Insufficient documentation

## 2021-05-18 DIAGNOSIS — Z7984 Long term (current) use of oral hypoglycemic drugs: Secondary | ICD-10-CM | POA: Insufficient documentation

## 2021-05-18 DIAGNOSIS — S46811A Strain of other muscles, fascia and tendons at shoulder and upper arm level, right arm, initial encounter: Secondary | ICD-10-CM | POA: Insufficient documentation

## 2021-05-18 MED ORDER — KETOROLAC TROMETHAMINE 30 MG/ML IJ SOLN
30.0000 mg | Freq: Once | INTRAMUSCULAR | Status: AC
  Administered 2021-05-18: 30 mg via INTRAMUSCULAR
  Filled 2021-05-18: qty 1

## 2021-05-18 MED ORDER — LIDOCAINE 5 % EX PTCH: 1.0000 | MEDICATED_PATCH | Freq: Two times a day (BID) | CUTANEOUS | 0 refills | Status: AC

## 2021-05-18 MED ORDER — LIDOCAINE 5 % EX PTCH
1.0000 | MEDICATED_PATCH | CUTANEOUS | Status: DC
  Administered 2021-05-18: 1 via TRANSDERMAL
  Filled 2021-05-18: qty 1

## 2021-05-18 NOTE — ED Notes (Signed)
Patient transported to X-ray 

## 2021-05-18 NOTE — ED Provider Notes (Signed)
[facility] Emergency Department Provider Note   ____________________________________________   Event Date/Time   First MD Initiated Contact with Patient 05/18/21 [phone]     (approximate)  I have reviewed the triage vital signs and the nursing notes.   HISTORY  Chief Complaint Shoulder Pain    HPI Jessica Norton is a 60 y.o. female with past medical history of hypertension, hyperlipidemia, diabetes, and DVT who presents to the ED complaining of shoulder pain.  Patient reports that she has been dealing with constant pain in her right shoulder and neck area for about the past week.  She describes pain as sharp and worse when she goes to use her right arm or turn her neck in a certain direction.  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  She has been taking Tylenol without relief, states that she was told she could not take ibuprofen in the past while she was on Xarelto.  She states her doctor has since stopped her Xarelto after she completed therapy.  She denies any fevers, cough, chest pain, or shortness of breath.        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 Authorizing Provider  lidocaine (LIDODERM) 5 % Place 1 patch onto the skin every 12 (twelve) hours. Remove & Discard patch within 12 hours or as directed by MD 05/18/21 05/18/22 Yes Chesley Noon, MD  atorvastatin (LIPITOR) 20 MG tablet Take 20 mg by mouth. 03/13/11   [provider]  gabapentin (NEURONTIN) 100 MG capsule Take 100 mg by mouth. 12/28/15   [provider]  lisinopril (PRINIVIL,ZESTRIL) 10 MG tablet Take 10 mg by mouth. 08/22/15 08/21/16  [provider]  metFORMIN (GLUCOPHAGE) 500 MG tablet Take 500 mg by mouth. 04/24/15 04/23/16  [provider]  rivaroxaban (XARELTO) 20 MG TABS tablet Take 1 tablet (20 mg total) by mouth daily with supper. 11/20/15   Rosey Bath, MD  traMADol (ULTRAM) 50 MG tablet Take 50 mg by mouth.    [provider]    Allergies Ibuprofen  Family History  Problem Relation Age of Onset   Cancer Paternal Grandfather    Breast cancer Neg Hx     Social History Social History   Tobacco Use   Smoking status: Former    Packs/day: 0.50    Years: 20.00    Pack years: 10.00    Types: Cigarettes    Quit date: 12/14/2014  Years since quitting: 6.4   Smokeless tobacco: Never  Substance Use Topics   Alcohol use: Yes    Alcohol/week: 2.0 standard drinks    Types: 2 Glasses of wine per week   Drug use: No    Review of Systems  Constitutional: No fever/chills Eyes: No visual changes. ENT: No sore throat. Cardiovascular: Denies chest pain. Respiratory: Denies shortness of breath. Gastrointestinal: No abdominal pain.  No nausea, no vomiting.  No diarrhea.  No constipation. Genitourinary: Negative for dysuria. Musculoskeletal: Positive for right shoulder and neck pain. Skin: Negative for rash. Neurological: Negative for headaches, focal weakness or  numbness.  ____________________________________________   PHYSICAL EXAM:  VITAL SIGNS: ED Triage Vitals  Enc Vitals Group     BP 05/18/21 0909 (!) 152/98     Pulse Rate 05/18/21 0909 75     Resp 05/18/21 0909 18     Temp 05/18/21 0909 98.5 F (36.9 C)     Temp Source 05/18/21 0909 Oral     SpO2 05/18/21 0909 99 %     Weight 05/18/21 0907 205 lb (93 kg)     Height 05/18/21 0907 5\' 4"  (1.626 m)     Head Circumference --      Peak Flow --      Pain Score 05/18/21 0906 6     Pain Loc --      Pain Edu? --      Excl. in GC? --     Constitutional: Alert and oriented. Eyes: Conjunctivae are normal. Head: Atraumatic. Nose: No congestion/rhinnorhea. Mouth/Throat: Mucous membranes are moist. Neck: Normal ROM, pain reproducible with palpation of right trapezius area. Cardiovascular: Normal rate, regular rhythm. Grossly normal heart sounds.  2+ radial pulses bilaterally. Respiratory: Normal respiratory effort.  No retractions. Lungs CTAB. Gastrointestinal: Soft and nontender. No distention. Genitourinary: deferred Musculoskeletal: No lower extremity tenderness nor edema. Neurologic:  Normal speech and language. No gross focal neurologic deficits are appreciated. Skin:  Skin is warm, dry and intact. No rash noted. Psychiatric: Mood and affect are normal. Speech and behavior are normal.  ____________________________________________   LABS (all labs ordered are listed, but only abnormal results are displayed)  Labs Reviewed - No data to display ____________________________________________  EKG  ED ECG REPORT I, 05/20/21, the attending physician, personally viewed and interpreted this ECG.   Date: 05/18/2021  EKG Time: 10:32  Rate: 61  Rhythm: normal sinus rhythm  Axis: Normal  Intervals:none  ST&T Change: None   PROCEDURES  Procedure(s) performed (including Critical Care):  Procedures   ____________________________________________   INITIAL IMPRESSION /  ASSESSMENT AND PLAN / ED COURSE      60 year old female with past medical history of hypertension, hyperlipidemia, diabetes, and DVT who presents to the ED complaining of 1 week of constant pain in her right shoulder and neck area that is worse with certain movements and while she is at work.  She is neurovascularly intact to her right upper extremity, pain reproducible with palpation over her trapezius.  We will check EKG but overall very low suspicion for cardiac etiology of her symptoms.  We will also check chest x-ray and x-ray of her right shoulder.  Symptoms seem consistent with trapezius strain and we will treat symptomatically with IM Toradol and Lidoderm patch.  There is now no contraindication for patient to receive NSAIDs as she is no longer taking Xarelto.  Chest x-ray and right shoulder x-ray reviewed by me with no infiltrate, edema, effusion, or fracture.  EKG shows no  evidence of arrhythmia or ischemia.  Patient symptoms are improving following Lidoderm patch and Toradol, she is appropriate for discharge home with PCP follow-up.  She was counseled to return to the ED for new worsening symptoms, patient agrees with plan.      ____________________________________________   FINAL CLINICAL IMPRESSION(S) / ED DIAGNOSES  Final diagnoses:  Strain of right trapezius muscle, initial encounter     ED Discharge Orders          Ordered    lidocaine (LIDODERM) 5 %  Every 12 hours        05/18/21 1036             Note:  This document was prepared using Dragon voice recognition software and may include unintentional dictation errors.    Chesley Noon, MD 05/18/21 1041

## 2021-05-18 NOTE — ED Notes (Signed)
Dc instructions and scripts reviewed with pt no questions or concerns at this time. Pt ambulated to ED lobby without difficulty.

## 2021-05-18 NOTE — ED Triage Notes (Signed)
Pt here with right side clavicle pain for 1 week. 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 PT in NAD in triage.

## 2022-04-08 ENCOUNTER — Emergency Department: Payer: Medicare Other

## 2022-04-08 ENCOUNTER — Encounter: Payer: Self-pay | Admitting: Emergency Medicine

## 2022-04-08 DIAGNOSIS — R0789 Other chest pain: Secondary | ICD-10-CM | POA: Insufficient documentation

## 2022-04-08 DIAGNOSIS — R0602 Shortness of breath: Secondary | ICD-10-CM | POA: Insufficient documentation

## 2022-04-08 DIAGNOSIS — Z7984 Long term (current) use of oral hypoglycemic drugs: Secondary | ICD-10-CM | POA: Insufficient documentation

## 2022-04-08 DIAGNOSIS — Z79899 Other long term (current) drug therapy: Secondary | ICD-10-CM | POA: Diagnosis not present

## 2022-04-08 DIAGNOSIS — E119 Type 2 diabetes mellitus without complications: Secondary | ICD-10-CM | POA: Insufficient documentation

## 2022-04-08 DIAGNOSIS — Z7901 Long term (current) use of anticoagulants: Secondary | ICD-10-CM | POA: Insufficient documentation

## 2022-04-08 DIAGNOSIS — I10 Essential (primary) hypertension: Secondary | ICD-10-CM | POA: Insufficient documentation

## 2022-04-08 DIAGNOSIS — R079 Chest pain, unspecified: Secondary | ICD-10-CM | POA: Diagnosis present

## 2022-04-08 LAB — CBC
HCT: 42.8 % (ref 36.0–46.0)
Hemoglobin: 13.8 g/dL (ref 12.0–15.0)
MCH: 29 pg (ref 26.0–34.0)
MCHC: 32.2 g/dL (ref 30.0–36.0)
MCV: 89.9 fL (ref 80.0–100.0)
Platelets: 206 10*3/uL (ref 150–400)
RBC: 4.76 MIL/uL (ref 3.87–5.11)
RDW: 15.5 % (ref 11.5–15.5)
WBC: 5.7 10*3/uL (ref 4.0–10.5)
nRBC: 0 % (ref 0.0–0.2)

## 2022-04-08 LAB — BASIC METABOLIC PANEL
Anion gap: 10 (ref 5–15)
BUN: 19 mg/dL (ref 6–20)
CO2: 23 mmol/L (ref 22–32)
Calcium: 8.8 mg/dL — ABNORMAL LOW (ref 8.9–10.3)
Chloride: 109 mmol/L (ref 98–111)
Creatinine, Ser: 1.14 mg/dL — ABNORMAL HIGH (ref 0.44–1.00)
GFR, Estimated: 55 mL/min — ABNORMAL LOW (ref >60)
Glucose, Bld: 182 mg/dL — ABNORMAL HIGH (ref 70–99)
Potassium: 3.6 mmol/L (ref 3.5–5.1)
Sodium: 142 mmol/L (ref 135–145)

## 2022-04-08 LAB — TROPONIN I (HIGH SENSITIVITY): Troponin I (High Sensitivity): 6 ng/L (ref <18)

## 2022-04-08 NOTE — ED Triage Notes (Signed)
Pt presents via POV with complaints of midsternal CP that started about 5 hours ago that has progressively worsened. She notes taken Tylenol PTA without any improvement in her sx. Rates the pain 7/10 - describes it as a "sharp" pain made worse with movement. Denies fevers, N/V/D.

## 2022-04-09 ENCOUNTER — Other Ambulatory Visit: Payer: Self-pay

## 2022-04-09 ENCOUNTER — Emergency Department
Admission: EM | Admit: 2022-04-09 | Discharge: 2022-04-09 | Disposition: A | Discharge status: Home / Self Care | Payer: Medicare Other | Attending: Emergency Medicine | Admitting: Emergency Medicine

## 2022-04-09 DIAGNOSIS — R0789 Other chest pain: Secondary | ICD-10-CM

## 2022-04-09 LAB — D-DIMER, QUANTITATIVE: D-Dimer, Quant: 0.31 ug/mL-FEU (ref 0.00–0.50)

## 2022-04-09 LAB — TROPONIN I (HIGH SENSITIVITY): Troponin I (High Sensitivity): 5 ng/L (ref <18)

## 2022-04-09 MED ORDER — ONDANSETRON HCL 4 MG/2ML IJ SOLN
4.0000 mg | Freq: Once | INTRAMUSCULAR | Status: AC
  Administered 2022-04-09: 4 mg via INTRAVENOUS
  Filled 2022-04-09: qty 2

## 2022-04-09 MED ORDER — SODIUM CHLORIDE 0.9 % IV BOLUS (SEPSIS)
1000.0000 mL | Freq: Once | INTRAVENOUS | Status: AC
  Administered 2022-04-09: 1000 mL via INTRAVENOUS

## 2022-04-09 MED ORDER — MORPHINE SULFATE (PF) 4 MG/ML IV SOLN
4.0000 mg | Freq: Once | INTRAVENOUS | Status: AC
  Administered 2022-04-09: 4 mg via INTRAVENOUS
  Filled 2022-04-09: qty 1

## 2022-04-09 NOTE — ED Provider Notes (Signed)
[facility] Provider Note    Event Date/Time   First MD Initiated Contact with Patient 04/09/22 0028     (approximate)   History   Chest Pain   HPI  Jessica Norton is a 61 y.o. female with history of hypertension, hyperlipidemia, diabetes, DVT on Xarelto who presents to the emergency department with complaints of sharp central chest pain that is worse with movement and deep inspiration.  No increased physical exertion or injury.  No fevers or cough.  Has had some shortness of breath.  No nausea, vomiting, diaphoresis, dizziness, fever or cough.  No lower extremity swelling or pain.   History provided by patient and family.    Past Medical History:  Diagnosis Date   Allergy    Diabetes mellitus without complication (HCC)    DVT (deep venous thrombosis) (HCC)    High cholesterol    Hypertension    TIA (transient ischemic attack) 2013    Past Surgical History:  Procedure Laterality Date   removed tumor from back  2005   SHOULDER SURGERY  2006 both shoulders   thumb surgery  2009   vocal chord surrgery  2013    MEDICATIONS:  Prior to Admission medications   Medication Sig Start Date End Date Taking? Authorizing Provider  atorvastatin (LIPITOR) 20 MG tablet Take 20 mg by mouth. 03/13/11   [provider]  gabapentin (NEURONTIN) 100 MG capsule Take 100 mg by mouth. 12/28/15   [provider]  lidocaine (LIDODERM) 5 % Place 1 patch onto the skin every 12 (twelve) hours. Remove & Discard patch within 12 hours or as directed by MD 05/18/21 05/18/22  Chesley Noon, MD  lisinopril (PRINIVIL,ZESTRIL) 10 MG tablet Take 10 mg by mouth. 08/22/15 08/21/16  [provider]  metFORMIN (GLUCOPHAGE) 500 MG tablet Take 500 mg by mouth. 04/24/15 04/23/16  [provider]  rivaroxaban (XARELTO) 20 MG TABS tablet Take 1 tablet (20 mg total) by mouth daily with supper. 11/20/15   Rosey Bath, MD  traMADol (ULTRAM) 50 MG tablet  Take 50 mg by mouth.    [provider]    Physical Exam   Triage Vital Signs: ED Triage Vitals  Enc Vitals Group     BP 04/08/22 2201 111/74     Pulse Rate 04/08/22 2201 66     Resp 04/08/22 2155 18     Temp 04/08/22 2201 97.8 F (36.6 C)     Temp Source 04/08/22 2155 Oral     SpO2 04/08/22 2201 97 %     Weight 04/08/22 2152 210 lb (95.3 kg)     Height 04/08/22 2152 5\' 4"  (1.626 m)     Head Circumference --      Peak Flow --      Pain Score 04/08/22 2152 7     Pain Loc --      Pain Edu? --      Excl. in GC? --     Most recent vital signs: Vitals:   04/09/22 0034 04/09/22 0130  BP: 110/75 123/86  Pulse: (!) 50 (!) 47  Resp: 15 15  Temp:    SpO2: 99% 99%    CONSTITUTIONAL: Alert and oriented and responds appropriately to questions. Well-appearing; well-nourished HEAD: Normocephalic, atraumatic EYES: Conjunctivae clear, pupils appear equal, sclera nonicteric ENT: normal nose; moist mucous membranes NECK: Supple, normal ROM CARD: RRR; S1 and S2 appreciated; no murmurs, no clicks, no rubs, no gallops CHEST:  Chest wall is  nontender to palpation.  No crepitus, ecchymosis, erythema, warmth, rash or other lesions present.   RESP: Normal chest excursion without splinting or tachypnea; breath sounds clear and equal bilaterally; no wheezes, no rhonchi, no rales, no hypoxia or respiratory distress, speaking full sentences ABD/GI: Normal bowel sounds; non-distended; soft, non-tender, no rebound, no guarding, no peritoneal signs BACK: The back appears normal EXT: Normal ROM in all joints; no deformity noted, no edema; no cyanosis, no calf tenderness or calf swelling SKIN: Normal color for age and race; warm; no rash on exposed skin NEURO: Moves all extremities equally, normal speech PSYCH: The patient's mood and manner are appropriate.   ED Results / Procedures / Treatments   LABS: (all labs ordered are listed, but only abnormal results are displayed) Labs Reviewed   BASIC METABOLIC PANEL - Abnormal; Notable for the following components:      Result Value   Glucose, Bld 182 (*)    Creatinine, Ser 1.14 (*)    Calcium 8.8 (*)    GFR, Estimated 55 (*)    All other components within normal limits  CBC  D-DIMER, QUANTITATIVE (NOT AT [facility])  TROPONIN I (HIGH SENSITIVITY)  TROPONIN I (HIGH SENSITIVITY)     EKG:  EKG Interpretation  Date/Time:  Monday April 08 2022 21:55:50 EDT Ventricular Rate:  61 PR Interval:  178 QRS Duration: 90 QT Interval:  434 QTC Calculation: 436 R Axis:   31 Text Interpretation: Normal sinus rhythm Low voltage QRS Borderline ECG When compared with ECG of 18-May-2021 10:32, No significant change was found Confirmed by Rochele Raring [phone]) on 04/09/2022 12:37:51 AM         RADIOLOGY: My personal review and interpretation of imaging: Chest x-ray clear.  I have personally reviewed all radiology reports.   DG Chest 2 View  Result Date: 04/08/2022 CLINICAL DATA:  Substernal chest pain EXAM: CHEST - 2 VIEW COMPARISON:  05/18/2021 FINDINGS: The heart size and mediastinal contours are within normal limits. Both lungs are clear. The visualized skeletal structures are unremarkable. IMPRESSION: No active cardiopulmonary disease. Electronically Signed   By: Alcide Clever M.D.   On: 04/08/2022 22:29     PROCEDURES:  Critical Care performed: No     .1-3 Lead EKG Interpretation  Performed by: Samora Jernberg, Layla Maw, DO Authorized by: Lenor Provencher, Layla Maw, DO     Interpretation: normal     ECG rate:  55   ECG rate assessment: bradycardic     Rhythm: sinus bradycardia     Ectopy: none     Conduction: normal       IMPRESSION / MDM / ASSESSMENT AND PLAN / ED COURSE  I reviewed the triage vital signs and the nursing notes.    Patient here with sharp chest pain worse with stretching and deep inspiration.  Does have history of previous DVT and multiple risk factors for ACS.  The patient is on the cardiac monitor to evaluate  for evidence of arrhythmia and/or significant heart rate changes.   DIFFERENTIAL DIAGNOSIS (includes but not limited to):   Likely musculoskeletal, chest wall pain but differential also includes ACS, PE.  Doubt dissection, pneumonia, pneumothorax, CHF.   Patient's presentation is most consistent with acute presentation with potential threat to life or bodily function.   PLAN: We will obtain CBC, troponin x2, BNP, D-dimer, chest x-ray.  EKG nonischemic.  She has had some mild bradycardia here with heart rate in the upper 40s.  EKG shows no AV blocks or interval abnormalities.  She is not on beta-blockers.  We will keep on cardiac monitoring and monitor closely.   MEDICATIONS GIVEN IN ED: Medications  sodium chloride 0.9 % bolus 1,000 mL (0 mLs Intravenous Stopped 04/09/22 0153)  ondansetron (ZOFRAN) injection 4 mg (4 mg Intravenous Given 04/09/22 0101)  morphine (PF) 4 MG/ML injection 4 mg (4 mg Intravenous Given 04/09/22 0101)     ED COURSE: Normal electrolytes, hemoglobin.  Troponin x2 negative.  D-dimer negative.  Chest x-ray reviewed and interpreted by myself and radiology and shows no acute abnormality.  Patient continues to have brief episodes of bradycardia into the upper 40s but is mostly staying in the 50s to 60s.  Pain has resolved after morphine.  Seems very atypical in nature.  I feel she is safe for discharge home with close outpatient follow-up.  Recommended Tylenol, Motrin over-the-counter as needed for pain control.   At this time, I do not feel there is any life-threatening condition present. I reviewed all nursing notes, vitals, pertinent previous records.  All lab and urine results, EKGs, imaging ordered have been independently reviewed and interpreted by myself.  I reviewed all available radiology reports from any imaging ordered this visit.  Based on my assessment, I feel the patient is safe to be discharged home without further emergent workup and can continue workup as an  outpatient as needed. Discussed all findings, treatment plan as well as usual and customary return precautions.  They verbalize understanding and are comfortable with this plan.  Outpatient follow-up has been provided as needed.  All questions have been answered.    CONSULTS: Admission considered given patient has risk factors for ACS and PE but work-up here has been reassuring, patient is asymptomatic currently and symptoms seem atypical.   OUTSIDE RECORDS REVIEWED: Reviewed patient's multiple outpatient office visits.  Patient did have a heart rate of 55 documented in March 2022.       FINAL CLINICAL IMPRESSION(S) / ED DIAGNOSES   Final diagnoses:  Atypical chest pain     Rx / DC Orders   ED Discharge Orders     None        Note:  This document was prepared using Dragon voice recognition software and may include unintentional dictation errors.   Maloni Musleh, Delice Bison, DO 04/09/22 [phone]

## 2022-04-09 NOTE — Discharge Instructions (Signed)
You may alternate Tylenol 1000 mg every 6 hours as needed for pain, fever and Ibuprofen 800 mg every 6-8 hours as needed for pain, fever.  Please take Ibuprofen with food.  Do not take more than 4000 mg of Tylenol (acetaminophen) in a 24 hour period. ° °
